# Patient Record
Sex: Female | Born: 1985 | Race: Black or African American | Hispanic: No | Marital: Married | Smoking: Never smoker
Health system: Southern US, Community
[De-identification: ages and names within clinical notes are randomized; demographics above are authoritative.]

## PROBLEM LIST (undated history)

## (undated) ENCOUNTER — Inpatient Hospital Stay (HOSPITAL_COMMUNITY): Payer: Self-pay

## (undated) DIAGNOSIS — R87629 Unspecified abnormal cytological findings in specimens from vagina: Secondary | ICD-10-CM

## (undated) DIAGNOSIS — N920 Excessive and frequent menstruation with regular cycle: Secondary | ICD-10-CM

## (undated) HISTORY — DX: Unspecified abnormal cytological findings in specimens from vagina: R87.629

## (undated) HISTORY — DX: Excessive and frequent menstruation with regular cycle: N92.0

## (undated) HISTORY — PX: COLPOSCOPY: SHX161

---

## 2013-12-02 NOTE — L&D Delivery Note (Signed)
Delivery Note At 3:10 PM a viable and healthy female was delivered via Vaginal, Spontaneous Delivery (Presentation: Left Occiput Anterior).  APGAR: 9, 9; weight .- pending Placenta status: Intact, Spontaneous.  Cord: 3 vessels with the following complications: None.  Cord pH: N/A  Anesthesia: Epidural  Episiotomy: None Lacerations: None Suture Repair: N/A Est. Blood Loss (mL): 250  Mom to postpartum.  Baby to Couplet care / Skin to Skin.  Alyaan Budzynski R 05/13/2014, 3:35 PM

## 2014-03-16 LAB — OB RESULTS CONSOLE ABO/RH: RH Type: NEGATIVE

## 2014-03-16 LAB — OB RESULTS CONSOLE GC/CHLAMYDIA
Chlamydia: NEGATIVE
GC PROBE AMP, GENITAL: NEGATIVE

## 2014-03-16 LAB — OB RESULTS CONSOLE HIV ANTIBODY (ROUTINE TESTING): HIV: NONREACTIVE

## 2014-03-16 LAB — OB RESULTS CONSOLE HEPATITIS B SURFACE ANTIGEN: Hepatitis B Surface Ag: NEGATIVE

## 2014-03-16 LAB — OB RESULTS CONSOLE ANTIBODY SCREEN: Antibody Screen: NEGATIVE

## 2014-03-16 LAB — OB RESULTS CONSOLE RPR: RPR: NONREACTIVE

## 2014-03-16 LAB — OB RESULTS CONSOLE RUBELLA ANTIBODY, IGM: Rubella: IMMUNE

## 2014-04-30 ENCOUNTER — Encounter (HOSPITAL_COMMUNITY): Payer: Self-pay | Admitting: *Deleted

## 2014-04-30 ENCOUNTER — Inpatient Hospital Stay (HOSPITAL_COMMUNITY)
Admission: AD | Admit: 2014-04-30 | Discharge: 2014-04-30 | Disposition: A | Discharge status: Home / Self Care | Source: Ambulatory Visit | Attending: Obstetrics and Gynecology | Admitting: Obstetrics and Gynecology

## 2014-04-30 DIAGNOSIS — O99891 Other specified diseases and conditions complicating pregnancy: Secondary | ICD-10-CM | POA: Insufficient documentation

## 2014-04-30 DIAGNOSIS — R109 Unspecified abdominal pain: Secondary | ICD-10-CM | POA: Insufficient documentation

## 2014-04-30 DIAGNOSIS — O9989 Other specified diseases and conditions complicating pregnancy, childbirth and the puerperium: Principal | ICD-10-CM

## 2014-04-30 LAB — URINALYSIS, ROUTINE W REFLEX MICROSCOPIC
Bilirubin Urine: NEGATIVE
GLUCOSE, UA: NEGATIVE mg/dL
Hgb urine dipstick: NEGATIVE
Ketones, ur: 15 mg/dL — AB
Leukocytes, UA: NEGATIVE
Nitrite: NEGATIVE
PROTEIN: NEGATIVE mg/dL
Specific Gravity, Urine: 1.01 (ref 1.005–1.030)
Urobilinogen, UA: 1 mg/dL (ref 0.0–1.0)
pH: 7.5 (ref 5.0–8.0)

## 2014-04-30 NOTE — MAU Note (Signed)
Patient presents with complaint of abdominal tightening X several days that worsened last night.

## 2014-04-30 NOTE — Discharge Instructions (Signed)

## 2014-05-04 LAB — OB RESULTS CONSOLE GBS: STREP GROUP B AG: NEGATIVE

## 2014-05-06 ENCOUNTER — Encounter (HOSPITAL_COMMUNITY): Payer: Self-pay | Admitting: *Deleted

## 2014-05-06 ENCOUNTER — Telehealth (HOSPITAL_COMMUNITY): Payer: Self-pay | Admitting: *Deleted

## 2014-05-06 NOTE — Telephone Encounter (Signed)
Preadmission screen  

## 2014-05-09 ENCOUNTER — Other Ambulatory Visit: Payer: Self-pay | Admitting: Obstetrics & Gynecology

## 2014-05-13 ENCOUNTER — Encounter (HOSPITAL_COMMUNITY): Payer: Self-pay

## 2014-05-13 ENCOUNTER — Inpatient Hospital Stay (HOSPITAL_COMMUNITY): Admitting: Anesthesiology

## 2014-05-13 ENCOUNTER — Inpatient Hospital Stay (HOSPITAL_COMMUNITY)
Admission: RE | Admit: 2014-05-13 | Discharge: 2014-05-14 | DRG: 775 | Disposition: A | Discharge status: Home / Self Care | Source: Ambulatory Visit | Attending: Obstetrics & Gynecology | Admitting: Obstetrics & Gynecology

## 2014-05-13 ENCOUNTER — Encounter (HOSPITAL_COMMUNITY): Admitting: Anesthesiology

## 2014-05-13 DIAGNOSIS — O9903 Anemia complicating the puerperium: Principal | ICD-10-CM | POA: Diagnosis present

## 2014-05-13 DIAGNOSIS — K219 Gastro-esophageal reflux disease without esophagitis: Secondary | ICD-10-CM | POA: Diagnosis present

## 2014-05-13 DIAGNOSIS — O360990 Maternal care for other rhesus isoimmunization, unspecified trimester, not applicable or unspecified: Secondary | ICD-10-CM | POA: Diagnosis present

## 2014-05-13 DIAGNOSIS — Z3490 Encounter for supervision of normal pregnancy, unspecified, unspecified trimester: Secondary | ICD-10-CM

## 2014-05-13 DIAGNOSIS — D62 Acute posthemorrhagic anemia: Secondary | ICD-10-CM | POA: Diagnosis present

## 2014-05-13 LAB — CBC
HEMATOCRIT: 30.3 % — AB (ref 36.0–46.0)
HEMOGLOBIN: 9.9 g/dL — AB (ref 12.0–15.0)
MCH: 29 pg (ref 26.0–34.0)
MCHC: 32.7 g/dL (ref 30.0–36.0)
MCV: 88.9 fL (ref 78.0–100.0)
Platelets: 228 10*3/uL (ref 150–400)
RBC: 3.41 MIL/uL — ABNORMAL LOW (ref 3.87–5.11)
RDW: 13.8 % (ref 11.5–15.5)
WBC: 6.6 10*3/uL (ref 4.0–10.5)

## 2014-05-13 LAB — ABO/RH: ABO/RH(D): O NEG

## 2014-05-13 LAB — RPR

## 2014-05-13 MED ORDER — PHENYLEPHRINE 40 MCG/ML (10ML) SYRINGE FOR IV PUSH (FOR BLOOD PRESSURE SUPPORT)
PREFILLED_SYRINGE | INTRAVENOUS | Status: AC
  Filled 2014-05-13: qty 10

## 2014-05-13 MED ORDER — ONDANSETRON HCL 4 MG/2ML IJ SOLN: 4.0000 mg | Freq: Four times a day (QID) | INTRAMUSCULAR | Status: DC | PRN

## 2014-05-13 MED ORDER — LIDOCAINE HCL (PF) 1 % IJ SOLN
INTRAMUSCULAR | Status: DC | PRN
  Administered 2014-05-13 (×2): 4 mL

## 2014-05-13 MED ORDER — TERBUTALINE SULFATE 1 MG/ML IJ SOLN: 0.2500 mg | Freq: Once | INTRAMUSCULAR | Status: DC | PRN

## 2014-05-13 MED ORDER — CITRIC ACID-SODIUM CITRATE 334-500 MG/5ML PO SOLN
30.0000 mL | ORAL | Status: DC | PRN
Start: 2014-05-13 — End: 2014-05-13

## 2014-05-13 MED ORDER — ONDANSETRON HCL 4 MG PO TABS: 4.0000 mg | ORAL_TABLET | ORAL | Status: DC | PRN

## 2014-05-13 MED ORDER — SENNOSIDES-DOCUSATE SODIUM 8.6-50 MG PO TABS
2.0000 | ORAL_TABLET | ORAL | Status: DC
  Administered 2014-05-13: 2 via ORAL
  Filled 2014-05-13: qty 2

## 2014-05-13 MED ORDER — IBUPROFEN 600 MG PO TABS
600.0000 mg | ORAL_TABLET | Freq: Four times a day (QID) | ORAL | Status: DC
  Administered 2014-05-13 – 2014-05-14 (×4): 600 mg via ORAL
  Filled 2014-05-13 (×5): qty 1

## 2014-05-13 MED ORDER — OXYCODONE-ACETAMINOPHEN 5-325 MG PO TABS
1.0000 | ORAL_TABLET | ORAL | Status: DC | PRN
  Administered 2014-05-13 – 2014-05-14 (×3): 1 via ORAL
  Filled 2014-05-13 (×3): qty 1

## 2014-05-13 MED ORDER — EPHEDRINE 5 MG/ML INJ
10.0000 mg | INTRAVENOUS | Status: DC | PRN
  Filled 2014-05-13: qty 2

## 2014-05-13 MED ORDER — ACETAMINOPHEN 325 MG PO TABS: 650.0000 mg | ORAL_TABLET | ORAL | Status: DC | PRN

## 2014-05-13 MED ORDER — LACTATED RINGERS IV SOLN
500.0000 mL | Freq: Once | INTRAVENOUS | Status: AC
  Administered 2014-05-13: 500 mL via INTRAVENOUS

## 2014-05-13 MED ORDER — DIBUCAINE 1 % RE OINT: 1.0000 "application " | TOPICAL_OINTMENT | RECTAL | Status: DC | PRN

## 2014-05-13 MED ORDER — LANOLIN HYDROUS EX OINT: TOPICAL_OINTMENT | CUTANEOUS | Status: DC | PRN

## 2014-05-13 MED ORDER — DIPHENHYDRAMINE HCL 50 MG/ML IJ SOLN: 12.5000 mg | INTRAMUSCULAR | Status: DC | PRN

## 2014-05-13 MED ORDER — FENTANYL 2.5 MCG/ML BUPIVACAINE 1/10 % EPIDURAL INFUSION (WH - ANES)
INTRAMUSCULAR | Status: DC | PRN
  Administered 2014-05-13: 14 mL/h via EPIDURAL

## 2014-05-13 MED ORDER — SIMETHICONE 80 MG PO CHEW: 80.0000 mg | CHEWABLE_TABLET | ORAL | Status: DC | PRN

## 2014-05-13 MED ORDER — TETANUS-DIPHTH-ACELL PERTUSSIS 5-2.5-18.5 LF-MCG/0.5 IM SUSP: 0.5000 mL | Freq: Once | INTRAMUSCULAR | Status: DC

## 2014-05-13 MED ORDER — MISOPROSTOL 25 MCG QUARTER TABLET
25.0000 ug | ORAL_TABLET | Freq: Once | ORAL | Status: AC
  Administered 2014-05-13: 25 ug via VAGINAL
  Filled 2014-05-13: qty 0.25

## 2014-05-13 MED ORDER — ZOLPIDEM TARTRATE 5 MG PO TABS: 5.0000 mg | ORAL_TABLET | Freq: Every evening | ORAL | Status: DC | PRN

## 2014-05-13 MED ORDER — OXYTOCIN BOLUS FROM INFUSION: 500.0000 mL | INTRAVENOUS | Status: DC

## 2014-05-13 MED ORDER — BENZOCAINE-MENTHOL 20-0.5 % EX AERO
1.0000 "application " | INHALATION_SPRAY | CUTANEOUS | Status: DC | PRN
  Filled 2014-05-13: qty 56

## 2014-05-13 MED ORDER — FENTANYL 2.5 MCG/ML BUPIVACAINE 1/10 % EPIDURAL INFUSION (WH - ANES)
INTRAMUSCULAR | Status: AC
  Filled 2014-05-13: qty 125

## 2014-05-13 MED ORDER — WITCH HAZEL-GLYCERIN EX PADS: 1.0000 "application " | MEDICATED_PAD | CUTANEOUS | Status: DC | PRN

## 2014-05-13 MED ORDER — OXYCODONE-ACETAMINOPHEN 5-325 MG PO TABS: 1.0000 | ORAL_TABLET | ORAL | Status: DC | PRN

## 2014-05-13 MED ORDER — LACTATED RINGERS IV SOLN
500.0000 mL | INTRAVENOUS | Status: DC | PRN
  Administered 2014-05-13: 500 mL via INTRAVENOUS

## 2014-05-13 MED ORDER — ONDANSETRON HCL 4 MG/2ML IJ SOLN: 4.0000 mg | INTRAMUSCULAR | Status: DC | PRN

## 2014-05-13 MED ORDER — PHENYLEPHRINE 40 MCG/ML (10ML) SYRINGE FOR IV PUSH (FOR BLOOD PRESSURE SUPPORT)
80.0000 ug | PREFILLED_SYRINGE | INTRAVENOUS | Status: DC | PRN
  Filled 2014-05-13: qty 2

## 2014-05-13 MED ORDER — OXYTOCIN 40 UNITS IN LACTATED RINGERS INFUSION - SIMPLE MED: 62.5000 mL/h | INTRAVENOUS | Status: DC

## 2014-05-13 MED ORDER — LIDOCAINE HCL (PF) 1 % IJ SOLN
30.0000 mL | INTRAMUSCULAR | Status: DC | PRN
  Filled 2014-05-13: qty 30

## 2014-05-13 MED ORDER — OXYTOCIN 40 UNITS IN LACTATED RINGERS INFUSION - SIMPLE MED
1.0000 m[IU]/min | INTRAVENOUS | Status: DC
  Filled 2014-05-13: qty 1000

## 2014-05-13 MED ORDER — PRENATAL MULTIVITAMIN CH
1.0000 | ORAL_TABLET | Freq: Every day | ORAL | Status: DC
  Administered 2014-05-14: 1 via ORAL
  Filled 2014-05-13: qty 1

## 2014-05-13 MED ORDER — EPHEDRINE 5 MG/ML INJ
INTRAVENOUS | Status: AC
  Filled 2014-05-13: qty 4

## 2014-05-13 MED ORDER — FENTANYL 2.5 MCG/ML BUPIVACAINE 1/10 % EPIDURAL INFUSION (WH - ANES): 14.0000 mL/h | INTRAMUSCULAR | Status: DC | PRN

## 2014-05-13 MED ORDER — FLEET ENEMA 7-19 GM/118ML RE ENEM: 1.0000 | ENEMA | Freq: Every day | RECTAL | Status: DC | PRN

## 2014-05-13 MED ORDER — LACTATED RINGERS IV SOLN
INTRAVENOUS | Status: DC
  Administered 2014-05-13 (×2): via INTRAVENOUS

## 2014-05-13 MED ORDER — IBUPROFEN 600 MG PO TABS: 600.0000 mg | ORAL_TABLET | Freq: Four times a day (QID) | ORAL | Status: DC | PRN

## 2014-05-13 MED ORDER — DIPHENHYDRAMINE HCL 25 MG PO CAPS
25.0000 mg | ORAL_CAPSULE | Freq: Four times a day (QID) | ORAL | Status: DC | PRN
Start: 2014-05-13 — End: 2014-05-14

## 2014-05-13 MED ORDER — PHENYLEPHRINE 40 MCG/ML (10ML) SYRINGE FOR IV PUSH (FOR BLOOD PRESSURE SUPPORT)
80.0000 ug | PREFILLED_SYRINGE | INTRAVENOUS | Status: DC | PRN
  Administered 2014-05-13 (×2): 80 ug via INTRAVENOUS
  Filled 2014-05-13: qty 2

## 2014-05-13 NOTE — Anesthesia Preprocedure Evaluation (Signed)
Anesthesia Evaluation  Patient identified by MRN, date of birth, ID band Patient awake    Reviewed: Allergy & Precautions, H&P , Patient's Chart, lab work & pertinent test results  Airway Mallampati: III TM Distance: >3 FB Neck ROM: Full    Dental no notable dental hx. (+) Teeth Intact   Pulmonary neg pulmonary ROS,  breath sounds clear to auscultation  Pulmonary exam normal       Cardiovascular negative cardio ROS  Rhythm:Regular Rate:Normal     Neuro/Psych negative neurological ROS  negative psych ROS   GI/Hepatic Neg liver ROS, GERD-  Medicated and Controlled,  Endo/Other  negative endocrine ROS  Renal/GU negative Renal ROS  negative genitourinary   Musculoskeletal negative musculoskeletal ROS (+)   Abdominal (+) - obese,   Peds  Hematology negative hematology ROS (+)   Anesthesia Other Findings   Reproductive/Obstetrics (+) Pregnancy                           Anesthesia Physical Anesthesia Plan  ASA: II  Anesthesia Plan: Epidural   Post-op Pain Management:    Induction:   Airway Management Planned: Natural Airway  Additional Equipment:   Intra-op Plan:   Post-operative Plan:   Informed Consent: I have reviewed the patients History and Physical, chart, labs and discussed the procedure including the risks, benefits and alternatives for the proposed anesthesia with the patient or authorized representative who has indicated his/her understanding and acceptance.     Plan Discussed with: Anesthesiologist  Anesthesia Plan Comments:         Anesthesia Quick Evaluation

## 2014-05-13 NOTE — H&P (Signed)
Tara [name] is a 28 y.o. female presenting at 4039 wks for labor IOL due to child care problems/  Late transfer of [facility] since husband moved, retired from [employer]. Good prenatal care prior to moving, all records available, normal labs and ultrasound, failed 1 hr glucola but passed 3 hr GTT before [place]. Rhogam at 28 wks. TDaP here after transfer. LSIL Pap in preg, Colpo prior to transfer, will check PP pap.  3 uncomplicated SVDs.   History OB History   Grav Para Term Preterm Abortions TAB SAB Ect Mult Living   4 3 3   0  0   3     Past Medical History  Diagnosis Date  . Medical history non-contributory   . GERD (gastroesophageal reflux disease)   . Vaginal Pap smear, abnormal    Past Surgical History  Procedure Laterality Date  . No past surgeries    . Colposcopy     Family History: family history is negative for Alcohol abuse, Arthritis, Asthma, Birth defects, Cancer, COPD, Diabetes, Depression, Drug abuse, Early death, Hearing loss, Heart disease, Hyperlipidemia, Hypertension, Kidney disease, Learning disabilities, Mental illness, Mental retardation, Miscarriages / Stillbirths, Stroke, Vision loss, and Varicose Veins. Social History:  reports that she has never smoked. She has never used smokeless tobacco. She reports that she does not drink alcohol or use illicit drugs.   Prenatal Transfer Tool  Maternal Diabetes: No (3 hr GTT normal)  Genetic Screening: Normal QUAD screen neg  Maternal Ultrasounds/Referrals: Normal Fetal Ultrasounds or other Referrals:  None Maternal Substance Abuse:  No Significant Maternal Medications:  None Significant Maternal Lab Results:  GBS(-), maternal anemia  Other Comments:  None  ROS neg    Exam Physical Exam  BP 98/50  Pulse 66  Temp(Src) 97.8 F (36.6 C) (Oral)  Resp 18  Ht 5' 3.5" (1.613 m)  Wt 153 lb (69.4 kg)  BMI 26.67 kg/m2  SpO2 100%  LMP 08/13/2013 A&O x 3, no acute distress. Pleasant HEENT neg, no  thyromegaly Lungs CTA bilat CV RRR, S1S2 normal Abdo soft, non tender, non acute Extr no edema/ tenderness Pelvic 1-2/thick/-4//VTx at admission FHT  140s/ category I Toco none EFW 7.1/2 to 8 lbs  Prenatal labs: ABO, Rh: O/Negative/-- (04/15 0000) Antibody: Negative (04/15 0000) Rubella: Immune (04/15 0000) RPR: NON REAC (06/12 0740)  HBsAg: Negative (04/15 0000)  HIV: Non-reactive (04/15 0000)  GBS: Negative (06/03 0000)  QUAD scr neg  Assessment/Plan: 28 yo G4P3003, 39wks for labor induction due to social reasons. Cytotec 25 mcg x 1 vaginal./ GBS neg. Anemia. Anticipate SVD.   Janson Lamar R 05/13/2014

## 2014-05-13 NOTE — Anesthesia Procedure Notes (Signed)
Epidural Patient location during procedure: OB Start time: 05/13/2014 2:01 PM  Staffing Anesthesiologist: Farren Landa A. Performed by: anesthesiologist   Preanesthetic Checklist Completed: patient identified, site marked, surgical consent, pre-op evaluation, timeout performed, IV checked, risks and benefits discussed and monitors and equipment checked  Epidural Patient position: sitting Prep: site prepped and draped and DuraPrep Patient monitoring: continuous pulse ox and blood pressure Approach: midline Location: L4-L5 Injection technique: LOR air  Needle:  Needle type: Tuohy  Needle gauge: 17 G Needle length: 9 cm and 9 Needle insertion depth: 4 cm Catheter type: closed end flexible Catheter size: 19 Gauge Catheter at skin depth: 9 cm Test dose: negative and Other  Assessment Events: blood not aspirated, injection not painful, no injection resistance, negative IV test and no paresthesia  Additional Notes Patient identified. Risks and benefits discussed including failed block, incomplete  Pain control, post dural puncture headache, nerve damage, paralysis, blood pressure Changes, nausea, vomiting, reactions to medications-both toxic and allergic and post Partum back pain. All questions were answered. Patient expressed understanding and wished to proceed. Sterile technique was used throughout procedure. Epidural site was Dressed with sterile barrier dressing. No paresthesias, signs of intravascular injection Or signs of intrathecal spread were encountered.  Patient was more comfortable after the epidural was dosed. Please see RN's note for documentation of vital signs and FHR which are stable.

## 2014-05-13 NOTE — Progress Notes (Signed)
Updated provider on pt UCs and FHR tracing--orders to continue to watch UC pattern and start pitocin if UCs spaceout

## 2014-05-14 ENCOUNTER — Encounter (HOSPITAL_COMMUNITY): Payer: Self-pay

## 2014-05-14 LAB — CBC
HCT: 28.3 % — ABNORMAL LOW (ref 36.0–46.0)
Hemoglobin: 9.4 g/dL — ABNORMAL LOW (ref 12.0–15.0)
MCH: 29.5 pg (ref 26.0–34.0)
MCHC: 33.2 g/dL (ref 30.0–36.0)
MCV: 88.7 fL (ref 78.0–100.0)
Platelets: 240 10*3/uL (ref 150–400)
RBC: 3.19 MIL/uL — AB (ref 3.87–5.11)
RDW: 13.6 % (ref 11.5–15.5)
WBC: 9 10*3/uL (ref 4.0–10.5)

## 2014-05-14 MED ORDER — MAGNESIUM OXIDE 400 (241.3 MG) MG PO TABS: 200.0000 mg | ORAL_TABLET | Freq: Every day | ORAL | Status: DC

## 2014-05-14 MED ORDER — RHO D IMMUNE GLOBULIN 1500 UNIT/2ML IJ SOSY
300.0000 ug | PREFILLED_SYRINGE | Freq: Once | INTRAMUSCULAR | Status: AC
  Administered 2014-05-14: 300 ug via INTRAVENOUS
  Filled 2014-05-14: qty 2

## 2014-05-14 MED ORDER — POLYSACCHARIDE IRON COMPLEX 150 MG PO CAPS: 150.0000 mg | ORAL_CAPSULE | Freq: Two times a day (BID) | ORAL | Status: DC

## 2014-05-14 MED ORDER — POLYSACCHARIDE IRON COMPLEX 150 MG PO CAPS
150.0000 mg | ORAL_CAPSULE | Freq: Two times a day (BID) | ORAL | Status: DC
  Administered 2014-05-14: 150 mg via ORAL
  Filled 2014-05-14 (×3): qty 1

## 2014-05-14 MED ORDER — IBUPROFEN 600 MG PO TABS: 600.0000 mg | ORAL_TABLET | Freq: Four times a day (QID) | ORAL | Status: DC

## 2014-05-14 MED ORDER — MAGNESIUM OXIDE 400 (241.3 MG) MG PO TABS
200.0000 mg | ORAL_TABLET | Freq: Every day | ORAL | Status: DC
  Administered 2014-05-14: 200 mg via ORAL
  Filled 2014-05-14 (×2): qty 0.5

## 2014-05-14 NOTE — Anesthesia Postprocedure Evaluation (Signed)
Anesthesia Post Note  Patient: Tara [name]  Procedure(s) Performed: * No procedures listed *  Anesthesia type: Epidural  Patient location: Mother/Baby  Post pain: Pain level controlled  Post assessment: Post-op Vital signs reviewed  Last Vitals:  Filed Vitals:   05/14/14 0531  BP: 120/71  Pulse: 80  Temp: 36.7 C  Resp: 18    Post vital signs: Reviewed  Level of consciousness:alert  Complications: No apparent anesthesia complications

## 2014-05-14 NOTE — Progress Notes (Signed)
PPD #1- SVD  Subjective:   Reports feeling well, wants early discharge Requesting birth control prior to discharge Tolerating po/ No nausea or vomiting Bleeding is light Pain controlled with Motrin Up ad lib / ambulatory / voiding without problems Newborn: bottlefeeding  / Circumcision: planning   Objective:   VS:  VS:  Filed Vitals:   05/13/14 1715 05/13/14 1815 05/13/14 2215 05/14/14 0531  BP: 104/68 117/80 124/76 120/71  Pulse: 86 82 76 80  Temp: 98.8 F (37.1 C) 98 F (36.7 C) 98.7 F (37.1 C) 98 F (36.7 C)  TempSrc: Oral Oral Oral Oral  Resp: 20 20 20 18   Height:      Weight:      SpO2:        LABS:  Recent Labs  05/13/14 0740 05/14/14 0555  WBC 6.6 9.0  HGB 9.9* 9.4*  PLT 228 240   Blood type: --/--/O NEG (06/12 0800) Rubella: Immune (04/15 0000)   I&O: Intake/Output     06/12 0701 - 06/13 0700 06/13 0701 - 06/14 0700   Urine (mL/kg/hr) 250 (0.2)    Blood 550 (0.3)    Total Output 800     Net -800          Urine Occurrence 2 x      Physical Exam: Alert and oriented x3 Abdomen: soft, non-tender, non-distended  Fundus: firm, non-tender, U-2 Perineum: intact Lochia: small Extremities: no edema, no calf pain or tenderness    Assessment:  PPD # 1G4P4004/ S/P:induced vaginal  IDA with compounding ABL anemia  Rh negative Doing well    Plan: Possible discharge later today Offered Depo for contraception, declines; advised no intercourse until after postpartum visit at 6 wks Rhogam prior to discharge Continue routine post partum orders    Donette LarryBHAMBRI, Quintessa Simmerman, N MSN, CNM 05/14/2014, 10:13 AM

## 2014-05-15 LAB — RH IG WORKUP (INCLUDES ABO/RH)
ABO/RH(D): O NEG
Antibody Screen: POSITIVE
DAT, IgG: NEGATIVE
Fetal Screen: NEGATIVE
Gestational Age(Wks): 39
Unit division: 0

## 2014-05-15 NOTE — Discharge Summary (Signed)
Obstetric Discharge Summary Reason for Admission: induction of labor and elective Prenatal Procedures: ultrasound and failed 1hr GTT, passed 3hr, Rhogam @28  wks, IDA Intrapartum Procedures: spontaneous vaginal delivery Postpartum Procedures: Rho(D) Ig Complications-Operative and Postpartum: none Hemoglobin  Date Value Ref Range Status  05/14/2014 9.4* 12.0 - 15.0 g/dL Final     HCT  Date Value Ref Range Status  05/14/2014 28.3* 36.0 - 46.0 % Final    Physical Exam:  General: alert and cooperative Lochia: appropriate Uterine Fundus: firm DVT Evaluation: No evidence of DVT seen on physical exam. Negative Homan's sign. No cords or calf tenderness. No significant calf/ankle edema.  Discharge Diagnoses: Term Pregnancy-delivered and Anemia  Discharge Information: Date: 05/15/2014 Activity: pelvic rest Diet: routine Medications: PNV, Ibuprofen and Iron Condition: stable Instructions: refer to practice specific booklet Discharge to: home Follow-up Information   Follow up with MODY,VAISHALI R, MD. Schedule an appointment as soon as possible for a visit in 6 weeks.   Specialty:  Obstetrics and Gynecology   Contact information:   [address] G[address] [place] [id number] [phone]       Newborn Data: Live born female on 05/13/14 Birth Weight: 8 lb 3.6 oz (3731 g) APGAR: 9, 9  Home with mother.  Charita Lindenberger, N 05/15/2014, 6:56 PM

## 2014-05-16 NOTE — Discharge Summary (Signed)
Reviewed and agree with note and plan. V.Matthews Franks, MD  

## 2014-10-03 ENCOUNTER — Encounter (HOSPITAL_COMMUNITY): Payer: Self-pay

## 2015-11-06 ENCOUNTER — Ambulatory Visit (INDEPENDENT_AMBULATORY_CARE_PROVIDER_SITE_OTHER): Admitting: Family Medicine

## 2015-11-06 ENCOUNTER — Encounter: Payer: Self-pay | Admitting: Family Medicine

## 2015-11-06 VITALS — BP 135/83 | HR 106 | Wt 150.0 lb

## 2015-11-06 DIAGNOSIS — Z3009 Encounter for other general counseling and advice on contraception: Secondary | ICD-10-CM

## 2015-11-06 DIAGNOSIS — Z30011 Encounter for initial prescription of contraceptive pills: Secondary | ICD-10-CM

## 2015-11-06 DIAGNOSIS — Z124 Encounter for screening for malignant neoplasm of cervix: Secondary | ICD-10-CM | POA: Diagnosis not present

## 2015-11-06 DIAGNOSIS — Z01419 Encounter for gynecological examination (general) (routine) without abnormal findings: Secondary | ICD-10-CM

## 2015-11-06 MED ORDER — NORGESTIMATE-ETH ESTRADIOL 0.25-35 MG-MCG PO TABS: 1.0000 | ORAL_TABLET | Freq: Every day | ORAL | Status: DC

## 2015-11-06 NOTE — Patient Instructions (Signed)
Preventive Care for Adults, Female A healthy lifestyle and preventive care can promote health and wellness. Preventive health guidelines for women include the following key practices.  A routine yearly physical is a good way to check with your health care provider about your health and preventive screening. It is a chance to share any concerns and updates on your health and to receive a thorough exam.  Visit your dentist for a routine exam and preventive care every 6 months. Brush your teeth twice a day and floss once a day. Good oral hygiene prevents tooth decay and gum disease.  The frequency of eye exams is based on your age, health, family medical history, use of contact lenses, and other factors. Follow your health care provider's recommendations for frequency of eye exams.  Eat a healthy diet. Foods like vegetables, fruits, whole grains, low-fat dairy products, and lean protein foods contain the nutrients you need without too many calories. Decrease your intake of foods high in solid fats, added sugars, and salt. Eat the right amount of calories for you.Get information about a proper diet from your health care provider, if necessary.  Regular physical exercise is one of the most important things you can do for your health. Most adults should get at least 150 minutes of moderate-intensity exercise (any activity that increases your heart rate and causes you to sweat) each week. In addition, most adults need muscle-strengthening exercises on 2 or more days a week.  Maintain a healthy weight. The body mass index (BMI) is a screening tool to identify possible weight problems. It provides an estimate of body fat based on height and weight. Your health care provider can find your BMI and can help you achieve or maintain a healthy weight.For adults 20 years and older:  A BMI below 18.5 is considered underweight.  A BMI of 18.5 to 24.9 is normal.  A BMI of 25 to 29.9 is considered overweight.  A  BMI of 30 and above is considered obese.  Maintain normal blood lipids and cholesterol levels by exercising and minimizing your intake of saturated fat. Eat a balanced diet with plenty of fruit and vegetables. Blood tests for lipids and cholesterol should begin at age 29 and be repeated every 5 years. If your lipid or cholesterol levels are high, you are over 50, or you are at high risk for heart disease, you may need your cholesterol levels checked more frequently.Ongoing high lipid and cholesterol levels should be treated with medicines if diet and exercise are not working.  If you smoke, find out from your health care provider how to quit. If you do not use tobacco, do not start.  Lung cancer screening is recommended for adults aged 42-80 years who are at high risk for developing lung cancer because of a history of smoking. A yearly low-dose CT scan of the lungs is recommended for people who have at least a 30-pack-year history of smoking and are a current smoker or have quit within the past 15 years. A pack year of smoking is smoking an average of 1 pack of cigarettes a day for 1 year (for example: 1 pack a day for 30 years or 2 packs a day for 15 years). Yearly screening should continue until the smoker has stopped smoking for at least 15 years. Yearly screening should be stopped for people who develop a health problem that would prevent them from having lung cancer treatment.  If you are pregnant, do not drink alcohol. If you are  breastfeeding, be very cautious about drinking alcohol. If you are not pregnant and choose to drink alcohol, do not have more than 1 drink per day. One drink is considered to be 12 ounces (355 mL) of beer, 5 ounces (148 mL) of wine, or 1.5 ounces (44 mL) of liquor.  Avoid use of street drugs. Do not share needles with anyone. Ask for help if you need support or instructions about stopping the use of drugs.  High blood pressure causes heart disease and increases the risk  of stroke. Your blood pressure should be checked at least every 1 to 2 years. Ongoing high blood pressure should be treated with medicines if weight loss and exercise do not work.  If you are 45-73 years old, ask your health care provider if you should take aspirin to prevent strokes.  Diabetes screening is done by taking a blood sample to check your blood glucose level after you have not eaten for a certain period of time (fasting). If you are not overweight and you do not have risk factors for diabetes, you should be screened once every 3 years starting at age 25. If you are overweight or obese and you are 2-78 years of age, you should be screened for diabetes every year as part of your cardiovascular risk assessment.  Breast cancer screening is essential preventive care for women. You should practice "breast self-awareness." This means understanding the normal appearance and feel of your breasts and may include breast self-examination. Any changes detected, no matter how small, should be reported to a health care provider. Women in their 60s and 30s should have a clinical breast exam (CBE) by a health care provider as part of a regular health exam every 1 to 3 years. After age 30, women should have a CBE every year. Starting at age 70, women should consider having a mammogram (breast X-ray test) every year. Women who have a family history of breast cancer should talk to their health care provider about genetic screening. Women at a high risk of breast cancer should talk to their health care providers about having an MRI and a mammogram every year.  Breast cancer gene (BRCA)-related cancer risk assessment is recommended for women who have family members with BRCA-related cancers. BRCA-related cancers include breast, ovarian, tubal, and peritoneal cancers. Having family members with these cancers may be associated with an increased risk for harmful changes (mutations) in the breast cancer genes BRCA1 and  BRCA2. Results of the assessment will determine the need for genetic counseling and BRCA1 and BRCA2 testing.  Your health care provider may recommend that you be screened regularly for cancer of the pelvic organs (ovaries, uterus, and vagina). This screening involves a pelvic examination, including checking for microscopic changes to the surface of your cervix (Pap test). You may be encouraged to have this screening done every 3 years, beginning at age 83.  For women ages 55-65, health care providers may recommend pelvic exams and Pap testing every 3 years, or they may recommend the Pap and pelvic exam, combined with testing for human papilloma virus (HPV), every 5 years. Some types of HPV increase your risk of cervical cancer. Testing for HPV may also be done on women of any age with unclear Pap test results.  Other health care providers may not recommend any screening for nonpregnant women who are considered low risk for pelvic cancer and who do not have symptoms. Ask your health care provider if a screening pelvic exam is right for  you.  If you have had past treatment for cervical cancer or a condition that could lead to cancer, you need Pap tests and screening for cancer for at least 20 years after your treatment. If Pap tests have been discontinued, your risk factors (such as having a new sexual partner) need to be reassessed to determine if screening should resume. Some women have medical problems that increase the chance of getting cervical cancer. In these cases, your health care provider may recommend more frequent screening and Pap tests.  Colorectal cancer can be detected and often prevented. Most routine colorectal cancer screening begins at the age of 50 years and continues through age 75 years. However, your health care provider may recommend screening at an earlier age if you have risk factors for colon cancer. On a yearly basis, your health care provider may provide home test kits to check  for hidden blood in the stool. Use of a small camera at the end of a tube, to directly examine the colon (sigmoidoscopy or colonoscopy), can detect the earliest forms of colorectal cancer. Talk to your health care provider about this at age 50, when routine screening begins. Direct exam of the colon should be repeated every 5-10 years through age 75 years, unless early forms of precancerous polyps or small growths are found.  People who are at an increased risk for hepatitis B should be screened for this virus. You are considered at high risk for hepatitis B if:  You were born in a country where hepatitis B occurs often. Talk with your health care provider about which countries are considered high risk.  Your parents were born in a high-risk country and you have not received a shot to protect against hepatitis B (hepatitis B vaccine).  You have HIV or AIDS.  You use needles to inject street drugs.  You live with, or have sex with, someone who has hepatitis B.  You get hemodialysis treatment.  You take certain medicines for conditions like cancer, organ transplantation, and autoimmune conditions.  Hepatitis C blood testing is recommended for all people born from 1945 through 1965 and any individual with known risks for hepatitis C.  Practice safe sex. Use condoms and avoid high-risk sexual practices to reduce the spread of sexually transmitted infections (STIs). STIs include gonorrhea, chlamydia, syphilis, trichomonas, herpes, HPV, and human immunodeficiency virus (HIV). Herpes, HIV, and HPV are viral illnesses that have no cure. They can result in disability, cancer, and death.  You should be screened for sexually transmitted illnesses (STIs) including gonorrhea and chlamydia if:  You are sexually active and are younger than 24 years.  You are older than 24 years and your health care provider tells you that you are at risk for this type of infection.  Your sexual activity has changed  since you were last screened and you are at an increased risk for chlamydia or gonorrhea. Ask your health care provider if you are at risk.  If you are at risk of being infected with HIV, it is recommended that you take a prescription medicine daily to prevent HIV infection. This is called preexposure prophylaxis (PrEP). You are considered at risk if:  You are sexually active and do not regularly use condoms or know the HIV status of your partner(s).  You take drugs by injection.  You are sexually active with a partner who has HIV.  Talk with your health care provider about whether you are at high risk of being infected with HIV. If   you choose to begin PrEP, you should first be tested for HIV. You should then be tested every 3 months for as long as you are taking PrEP.  Osteoporosis is a disease in which the bones lose minerals and strength with aging. This can result in serious bone fractures or breaks. The risk of osteoporosis can be identified using a bone density scan. Women ages 5 years and over and women at risk for fractures or osteoporosis should discuss screening with their health care providers. Ask your health care provider whether you should take a calcium supplement or vitamin D to reduce the rate of osteoporosis.  Menopause can be associated with physical symptoms and risks. Hormone replacement therapy is available to decrease symptoms and risks. You should talk to your health care provider about whether hormone replacement therapy is right for you.  Use sunscreen. Apply sunscreen liberally and repeatedly throughout the day. You should seek shade when your shadow is shorter than you. Protect yourself by wearing long sleeves, pants, a wide-brimmed hat, and sunglasses year round, whenever you are outdoors.  Once a month, do a whole body skin exam, using a mirror to look at the skin on your back. Tell your health care provider of new moles, moles that have irregular borders, moles that  are larger than a pencil eraser, or moles that have changed in shape or color.  Stay current with required vaccines (immunizations).  Influenza vaccine. All adults should be immunized every year.  Tetanus, diphtheria, and acellular pertussis (Td, Tdap) vaccine. Pregnant women should receive 1 dose of Tdap vaccine during each pregnancy. The dose should be obtained regardless of the length of time since the last dose. Immunization is preferred during the 27th-36th week of gestation. An adult who has not previously received Tdap or who does not know her vaccine status should receive 1 dose of Tdap. This initial dose should be followed by tetanus and diphtheria toxoids (Td) booster doses every 10 years. Adults with an unknown or incomplete history of completing a 3-dose immunization series with Td-containing vaccines should begin or complete a primary immunization series including a Tdap dose. Adults should receive a Td booster every 10 years.  Varicella vaccine. An adult without evidence of immunity to varicella should receive 2 doses or a second dose if she has previously received 1 dose. Pregnant females who do not have evidence of immunity should receive the first dose after pregnancy. This first dose should be obtained before leaving the health care facility. The second dose should be obtained 4-8 weeks after the first dose.  Human papillomavirus (HPV) vaccine. Females aged 13-26 years who have not received the vaccine previously should obtain the 3-dose series. The vaccine is not recommended for use in pregnant females. However, pregnancy testing is not needed before receiving a dose. If a female is found to be pregnant after receiving a dose, no treatment is needed. In that case, the remaining doses should be delayed until after the pregnancy. Immunization is recommended for any person with an immunocompromised condition through the age of 47 years if she did not get any or all doses earlier. During the  3-dose series, the second dose should be obtained 4-8 weeks after the first dose. The third dose should be obtained 24 weeks after the first dose and 16 weeks after the second dose.  Zoster vaccine. One dose is recommended for adults aged 17 years or older unless certain conditions are present.  Measles, mumps, and rubella (MMR) vaccine. Adults born  before 1957 generally are considered immune to measles and mumps. Adults born in [birth year] or later should have 1 or more doses of MMR vaccine unless there is a contraindication to the vaccine or there is laboratory evidence of immunity to each of the three diseases. A routine second dose of MMR vaccine should be obtained at least 28 days after the first dose for students attending postsecondary schools, health care workers, or international travelers. People who received inactivated measles vaccine or an unknown type of measles vaccine during 1963-1967 should receive 2 doses of MMR vaccine. People who received inactivated mumps vaccine or an unknown type of mumps vaccine before 1979 and are at high risk for mumps infection should consider immunization with 2 doses of MMR vaccine. For females of childbearing age, rubella immunity should be determined. If there is no evidence of immunity, females who are not pregnant should be vaccinated. If there is no evidence of immunity, females who are pregnant should delay immunization until after pregnancy. Unvaccinated health care workers born before [birth year] who lack laboratory evidence of measles, mumps, or rubella immunity or laboratory confirmation of disease should consider measles and mumps immunization with 2 doses of MMR vaccine or rubella immunization with 1 dose of MMR vaccine.  Pneumococcal 13-valent conjugate (PCV13) vaccine. When indicated, a person who is uncertain of his immunization history and has no record of immunization should receive the PCV13 vaccine. All adults 21 years of age and older should receive this  vaccine. An adult aged 34 years or older who has certain medical conditions and has not been previously immunized should receive 1 dose of PCV13 vaccine. This PCV13 should be followed with a dose of pneumococcal polysaccharide (PPSV23) vaccine. Adults who are at high risk for pneumococcal disease should obtain the PPSV23 vaccine at least 8 weeks after the dose of PCV13 vaccine. Adults older than 27 years of age who have normal immune system function should obtain the PPSV23 vaccine dose at least 1 year after the dose of PCV13 vaccine.  Pneumococcal polysaccharide (PPSV23) vaccine. When PCV13 is also indicated, PCV13 should be obtained first. All adults aged 109 years and older should be immunized. An adult younger than age 71 years who has certain medical conditions should be immunized. Any person who resides in a nursing home or long-term care facility should be immunized. An adult smoker should be immunized. People with an immunocompromised condition and certain other conditions should receive both PCV13 and PPSV23 vaccines. People with human immunodeficiency virus (HIV) infection should be immunized as soon as possible after diagnosis. Immunization during chemotherapy or radiation therapy should be avoided. Routine use of PPSV23 vaccine is not recommended for American Indians, [place] Natives, or people younger than 65 years unless there are medical conditions that require PPSV23 vaccine. When indicated, people who have unknown immunization and have no record of immunization should receive PPSV23 vaccine. One-time revaccination 5 years after the first dose of PPSV23 is recommended for people aged 19-64 years who have chronic kidney failure, nephrotic syndrome, asplenia, or immunocompromised conditions. People who received 1-2 doses of PPSV23 before age 7 years should receive another dose of PPSV23 vaccine at age 15 years or later if at least 5 years have passed since the previous dose. Doses of PPSV23 are not  needed for people immunized with PPSV23 at or after age 51 years.  Meningococcal vaccine. Adults with asplenia or persistent complement component deficiencies should receive 2 doses of quadrivalent meningococcal conjugate (MenACWY-D) vaccine. The doses should be obtained  at least 2 months apart. Microbiologists working with certain meningococcal bacteria, [employer] recruits, people at risk during an outbreak, and people who travel to or live in countries with a high rate of meningitis should be immunized. A first-year college student up through age 31 years who is living in a residence hall should receive a dose if she did not receive a dose on or after her 16th birthday. Adults who have certain high-risk conditions should receive one or more doses of vaccine.  Hepatitis A vaccine. Adults who wish to be protected from this disease, have certain high-risk conditions, work with hepatitis A-infected animals, work in hepatitis A research labs, or travel to or work in countries with a high rate of hepatitis A should be immunized. Adults who were previously unvaccinated and who anticipate close contact with an international adoptee during the first 60 days after arrival in the [place] from a country with a high rate of hepatitis A should be immunized.  Hepatitis B vaccine. Adults who wish to be protected from this disease, have certain high-risk conditions, may be exposed to blood or other infectious body fluids, are household contacts or sex partners of hepatitis B positive people, are clients or workers in certain care facilities, or travel to or work in countries with a high rate of hepatitis B should be immunized.  Haemophilus influenzae type b (Hib) vaccine. A previously unvaccinated person with asplenia or sickle cell disease or having a scheduled splenectomy should receive 1 dose of Hib vaccine. Regardless of previous immunization, a recipient of a hematopoietic stem cell transplant should receive a  3-dose series 6-12 months after her successful transplant. Hib vaccine is not recommended for adults with HIV infection. Preventive Services / Frequency Ages 64 to 62 years  Blood pressure check.** / Every 3-5 years.  Lipid and cholesterol check.** / Every 5 years beginning at age 80.  Clinical breast exam.** / Every 3 years for women in their 21s and 52s.  BRCA-related cancer risk assessment.** / For women who have family members with a BRCA-related cancer (breast, ovarian, tubal, or peritoneal cancers).  Pap test.** / Every 2 years from ages 40 through 49. Every 3 years starting at age 12 through age 25 or 65 with a history of 3 consecutive normal Pap tests.  HPV screening.** / Every 3 years from ages 74 through ages 91 to 61 with a history of 3 consecutive normal Pap tests.  Hepatitis C blood test.** / For any individual with known risks for hepatitis C.  Skin self-exam. / Monthly.  Influenza vaccine. / Every year.  Tetanus, diphtheria, and acellular pertussis (Tdap, Td) vaccine.** / Consult your health care provider. Pregnant women should receive 1 dose of Tdap vaccine during each pregnancy. 1 dose of Td every 10 years.  Varicella vaccine.** / Consult your health care provider. Pregnant females who do not have evidence of immunity should receive the first dose after pregnancy.  HPV vaccine. / 3 doses over 6 months, if 85 and younger. The vaccine is not recommended for use in pregnant females. However, pregnancy testing is not needed before receiving a dose.  Measles, mumps, rubella (MMR) vaccine.** / You need at least 1 dose of MMR if you were born in 1957 or later. You may also need a 2nd dose. For females of childbearing age, rubella immunity should be determined. If there is no evidence of immunity, females who are not pregnant should be vaccinated. If there is no evidence of immunity, females who are  pregnant should delay immunization until after pregnancy.  Pneumococcal  13-valent conjugate (PCV13) vaccine.** / Consult your health care provider.  Pneumococcal polysaccharide (PPSV23) vaccine.** / 1 to 2 doses if you smoke cigarettes or if you have certain conditions.  Meningococcal vaccine.** / 1 dose if you are age 68 to 8 years and a Market researcher living in a residence hall, or have one of several medical conditions, you need to get vaccinated against meningococcal disease. You may also need additional booster doses.  Hepatitis A vaccine.** / Consult your health care provider.  Hepatitis B vaccine.** / Consult your health care provider.  Haemophilus influenzae type b (Hib) vaccine.** / Consult your health care provider. Ages 7 to 53 years  Blood pressure check.** / Every year.  Lipid and cholesterol check.** / Every 5 years beginning at age 25 years.  Lung cancer screening. / Every year if you are aged 11-80 years and have a 30-pack-year history of smoking and currently smoke or have quit within the past 15 years. Yearly screening is stopped once you have quit smoking for at least 15 years or develop a health problem that would prevent you from having lung cancer treatment.  Clinical breast exam.** / Every year after age 48 years.  BRCA-related cancer risk assessment.** / For women who have family members with a BRCA-related cancer (breast, ovarian, tubal, or peritoneal cancers).  Mammogram.** / Every year beginning at age 41 years and continuing for as long as you are in good health. Consult with your health care provider.  Pap test.** / Every 3 years starting at age 65 years through age 37 or 70 years with a history of 3 consecutive normal Pap tests.  HPV screening.** / Every 3 years from ages 72 years through ages 60 to 40 years with a history of 3 consecutive normal Pap tests.  Fecal occult blood test (FOBT) of stool. / Every year beginning at age 21 years and continuing until age 5 years. You may not need to do this test if you get  a colonoscopy every 10 years.  Flexible sigmoidoscopy or colonoscopy.** / Every 5 years for a flexible sigmoidoscopy or every 10 years for a colonoscopy beginning at age 35 years and continuing until age 48 years.  Hepatitis C blood test.** / For all people born from [birth year] through 1963 and any individual with known risks for hepatitis C.  Skin self-exam. / Monthly.  Influenza vaccine. / Every year.  Tetanus, diphtheria, and acellular pertussis (Tdap/Td) vaccine.** / Consult your health care provider. Pregnant women should receive 1 dose of Tdap vaccine during each pregnancy. 1 dose of Td every 10 years.  Varicella vaccine.** / Consult your health care provider. Pregnant females who do not have evidence of immunity should receive the first dose after pregnancy.  Zoster vaccine.** / 1 dose for adults aged 30 years or older.  Measles, mumps, rubella (MMR) vaccine.** / You need at least 1 dose of MMR if you were born in 1957 or later. You may also need a second dose. For females of childbearing age, rubella immunity should be determined. If there is no evidence of immunity, females who are not pregnant should be vaccinated. If there is no evidence of immunity, females who are pregnant should delay immunization until after pregnancy.  Pneumococcal 13-valent conjugate (PCV13) vaccine.** / Consult your health care provider.  Pneumococcal polysaccharide (PPSV23) vaccine.** / 1 to 2 doses if you smoke cigarettes or if you have certain conditions.  Meningococcal vaccine.** /  Consult your health care provider.  Hepatitis A vaccine.** / Consult your health care provider.  Hepatitis B vaccine.** / Consult your health care provider.  Haemophilus influenzae type b (Hib) vaccine.** / Consult your health care provider. Ages 64 years and over  Blood pressure check.** / Every year.  Lipid and cholesterol check.** / Every 5 years beginning at age 23 years.  Lung cancer screening. / Every year if you  are aged 16-80 years and have a 30-pack-year history of smoking and currently smoke or have quit within the past 15 years. Yearly screening is stopped once you have quit smoking for at least 15 years or develop a health problem that would prevent you from having lung cancer treatment.  Clinical breast exam.** / Every year after age 74 years.  BRCA-related cancer risk assessment.** / For women who have family members with a BRCA-related cancer (breast, ovarian, tubal, or peritoneal cancers).  Mammogram.** / Every year beginning at age 44 years and continuing for as long as you are in good health. Consult with your health care provider.  Pap test.** / Every 3 years starting at age 58 years through age 22 or 39 years with 3 consecutive normal Pap tests. Testing can be stopped between 65 and 70 years with 3 consecutive normal Pap tests and no abnormal Pap or HPV tests in the past 10 years.  HPV screening.** / Every 3 years from ages 64 years through ages 70 or 61 years with a history of 3 consecutive normal Pap tests. Testing can be stopped between 65 and 70 years with 3 consecutive normal Pap tests and no abnormal Pap or HPV tests in the past 10 years.  Fecal occult blood test (FOBT) of stool. / Every year beginning at age 40 years and continuing until age 27 years. You may not need to do this test if you get a colonoscopy every 10 years.  Flexible sigmoidoscopy or colonoscopy.** / Every 5 years for a flexible sigmoidoscopy or every 10 years for a colonoscopy beginning at age 7 years and continuing until age 32 years.  Hepatitis C blood test.** / For all people born from [birth year] through 1963 and any individual with known risks for hepatitis C.  Osteoporosis screening.** / A one-time screening for women ages 30 years and over and women at risk for fractures or osteoporosis.  Skin self-exam. / Monthly.  Influenza vaccine. / Every year.  Tetanus, diphtheria, and acellular pertussis (Tdap/Td)  vaccine.** / 1 dose of Td every 10 years.  Varicella vaccine.** / Consult your health care provider.  Zoster vaccine.** / 1 dose for adults aged 35 years or older.  Pneumococcal 13-valent conjugate (PCV13) vaccine.** / Consult your health care provider.  Pneumococcal polysaccharide (PPSV23) vaccine.** / 1 dose for all adults aged 46 years and older.  Meningococcal vaccine.** / Consult your health care provider.  Hepatitis A vaccine.** / Consult your health care provider.  Hepatitis B vaccine.** / Consult your health care provider.  Haemophilus influenzae type b (Hib) vaccine.** / Consult your health care provider. ** Family history and personal history of risk and conditions may change your health care provider's recommendations.   This information is not intended to replace advice given to you by your health care provider. Make sure you discuss any questions you have with your health care provider.   Document Released: 01/14/2002 Document Revised: 12/09/2014 Document Reviewed: 04/15/2011 Elsevier Interactive Patient Education [employer].

## 2015-11-06 NOTE — Progress Notes (Signed)
  Subjective:     Tara [name] is a 29 y.o. female and is here for a comprehensive physical exam. The patient reports no problems.  Social History   Social History  . Marital Status: Married    Spouse Name: N/A  . Number of Children: N/A  . Years of Education: N/A   Occupational History  . Not on file.   Social History Main Topics  . Smoking status: Never Smoker   . Smokeless tobacco: Never Used  . Alcohol Use: No  . Drug Use: No  . Sexual Activity: Yes    Birth Control/ Protection: None   Other Topics Concern  . Not on file   Social History Narrative   Health Maintenance  Topic Date Due  . Janet BerlinETANUS/TDAP  11/06/2007  . PAP SMEAR  11/05/2009  . INFLUENZA VACCINE  07/03/2015  . HIV Screening  Completed    The following portions of the patient's history were reviewed and updated as appropriate: allergies, current medications, past family history, past medical history, past social history, past surgical history and problem list.  Review of Systems Pertinent items noted in HPI and remainder of comprehensive ROS otherwise negative.   Objective:    BP 135/83 mmHg  Pulse 106  Wt 150 lb (68.04 kg)  LMP 10/17/2015  Breastfeeding? No General appearance: alert, cooperative and appears stated age Head: Normocephalic, without obvious abnormality, atraumatic Neck: no adenopathy, supple, symmetrical, trachea midline and thyroid not enlarged, symmetric, no tenderness/mass/nodules Lungs: clear to auscultation bilaterally Breasts: normal appearance, no masses or tenderness Heart: regular rate and rhythm, S1, S2 normal, no murmur, click, rub or gallop Abdomen: soft, non-tender; bowel sounds normal; no masses,  no organomegaly Pelvic: cervix normal in appearance, external genitalia normal, no adnexal masses or tenderness, no cervical motion tenderness, uterus normal size, shape, and consistency and vagina normal without discharge Extremities: extremities normal, atraumatic, no  cyanosis or edema Pulses: 2+ and symmetric Skin: Skin color, texture, turgor normal. No rashes or lesions Lymph nodes: Cervical, supraclavicular, and axillary nodes normal. Neurologic: Grossly normal    Assessment:    Healthy female exam.      Plan:   Problem List Items Addressed This Visit    None    Visit Diagnoses    Encounter for routine gynecological examination    -  Primary    Relevant Orders    TSH    CBC    Lipid panel    Comprehensive metabolic panel    Screening for malignant neoplasm of cervix        Relevant Orders    Pap Lb, rfx HPV ASCU    Counseling for birth control, oral contraceptives        Relevant Medications    norgestimate-ethinyl estradiol (ORTHO-CYCLEN,SPRINTEC,PREVIFEM) 0.25-35 MG-MCG tablet        See After Visit Summary for Counseling Recommendations

## 2015-11-07 LAB — CBC
Hematocrit: 38.8 % (ref 34.0–46.6)
Hemoglobin: 12.9 g/dL (ref 11.1–15.9)
MCH: 30.4 pg (ref 26.6–33.0)
MCHC: 33.2 g/dL (ref 31.5–35.7)
MCV: 92 fL (ref 79–97)
Platelets: 457 10*3/uL — ABNORMAL HIGH (ref 150–379)
RBC: 4.24 x10E6/uL (ref 3.77–5.28)
RDW: 13.1 % (ref 12.3–15.4)
WBC: 6.2 10*3/uL (ref 3.4–10.8)

## 2015-11-07 LAB — COMPREHENSIVE METABOLIC PANEL
ALBUMIN: 4.4 g/dL (ref 3.5–5.5)
ALK PHOS: 99 IU/L (ref 39–117)
ALT: 32 IU/L (ref 0–32)
AST: 27 IU/L (ref 0–40)
Albumin/Globulin Ratio: 1.6 (ref 1.1–2.5)
BUN / CREAT RATIO: 12 (ref 8–20)
BUN: 8 mg/dL (ref 6–20)
Bilirubin Total: <0.2 mg/dL (ref 0.0–1.2)
CALCIUM: 9.2 mg/dL (ref 8.7–10.2)
CO2: 25 mmol/L (ref 18–29)
CREATININE: 0.67 mg/dL (ref 0.57–1.00)
Chloride: 100 mmol/L (ref 97–106)
GFR calc Af Amer: 139 mL/min/{1.73_m2} (ref >59)
GFR, EST NON AFRICAN AMERICAN: 121 mL/min/{1.73_m2} (ref >59)
GLUCOSE: 111 mg/dL — AB (ref 65–99)
Globulin, Total: 2.8 g/dL (ref 1.5–4.5)
Potassium: 3.6 mmol/L (ref 3.5–5.2)
Sodium: 141 mmol/L (ref 136–144)
TOTAL PROTEIN: 7.2 g/dL (ref 6.0–8.5)

## 2015-11-07 LAB — LIPID PANEL
CHOLESTEROL TOTAL: 205 mg/dL — AB (ref 100–199)
Chol/HDL Ratio: 4 ratio units (ref 0.0–4.4)
HDL: 51 mg/dL (ref >39)
LDL Calculated: 127 mg/dL — ABNORMAL HIGH (ref 0–99)
Triglycerides: 135 mg/dL (ref 0–149)
VLDL Cholesterol Cal: 27 mg/dL (ref 5–40)

## 2015-11-07 LAB — TSH: TSH: 1.49 u[IU]/mL (ref 0.450–4.500)

## 2015-11-11 LAB — PAP LB, RFX HPV ASCU: PAP SMEAR COMMENT: 0

## 2015-11-13 ENCOUNTER — Telehealth: Payer: Self-pay | Admitting: *Deleted

## 2015-11-13 NOTE — Telephone Encounter (Signed)
-----   Message from Tanya S Pratt, MD sent at 11/07/2015  9:17 AM EST ----- Labs are ok for not fasting--still waiting on pap 

## 2015-11-13 NOTE — Telephone Encounter (Signed)
Informed pt of pap and lab results.

## 2015-11-13 NOTE — Telephone Encounter (Signed)
Called pt, no answer, left message to call office.

## 2015-11-13 NOTE — Telephone Encounter (Signed)
-----   Message from Reva Boresanya S Pratt, MD sent at 11/07/2015  9:17 AM EST ----- Labs are ok for not fasting--still waiting on pap

## 2016-01-23 ENCOUNTER — Ambulatory Visit (INDEPENDENT_AMBULATORY_CARE_PROVIDER_SITE_OTHER): Admitting: Internal Medicine

## 2016-01-23 ENCOUNTER — Encounter: Payer: Self-pay | Admitting: Internal Medicine

## 2016-01-23 VITALS — BP 124/72 | HR 88 | Temp 99.0°F | Ht 63.33 in | Wt 151.0 lb

## 2016-01-23 DIAGNOSIS — F4310 Post-traumatic stress disorder, unspecified: Secondary | ICD-10-CM

## 2016-01-23 DIAGNOSIS — Z658 Other specified problems related to psychosocial circumstances: Secondary | ICD-10-CM

## 2016-01-23 DIAGNOSIS — F439 Reaction to severe stress, unspecified: Secondary | ICD-10-CM

## 2016-01-23 MED ORDER — ESCITALOPRAM OXALATE 10 MG PO TABS: 10.0000 mg | ORAL_TABLET | Freq: Every day | ORAL | Status: DC

## 2016-01-23 NOTE — Progress Notes (Signed)
Pre visit review using our clinic review tool, if applicable. No additional management support is needed unless otherwise documented below in the visit note. 

## 2016-01-23 NOTE — Patient Instructions (Signed)
Stress and Stress Management Stress is a normal reaction to life events. It is what you feel when life demands more than you are used to or more than you can handle. Some stress can be useful. For example, the stress reaction can help you catch the last bus of the day, study for a test, or meet a deadline at work. But stress that occurs too often or for too long can cause problems. It can affect your emotional health and interfere with relationships and normal daily activities. Too much stress can weaken your immune system and increase your risk for physical illness. If you already have a medical problem, stress can make it worse. CAUSES  All sorts of life events may cause stress. An event that causes stress for one person may not be stressful for another person. Major life events commonly cause stress. These may be positive or negative. Examples include losing your job, moving into a new home, getting married, having a baby, or losing a loved one. Less obvious life events may also cause stress, especially if they occur day after day or in combination. Examples include working long hours, driving in traffic, caring for children, being in debt, or being in a difficult relationship. SIGNS AND SYMPTOMS Stress may cause emotional symptoms including, the following:  Anxiety. This is feeling worried, afraid, on edge, overwhelmed, or out of control.  Anger. This is feeling irritated or impatient.  Depression. This is feeling sad, down, helpless, or guilty.  Difficulty focusing, remembering, or making decisions. Stress may cause physical symptoms, including the following:   Aches and pains. These may affect your head, neck, back, stomach, or other areas of your body.  Tight muscles or clenched jaw.  Low energy or trouble sleeping. Stress may cause unhealthy behaviors, including the following:   Eating to feel better (overeating) or skipping meals.  Sleeping too little, too much, or both.  Working  too much or putting off tasks (procrastination).  Smoking, drinking alcohol, or using drugs to feel better. DIAGNOSIS  Stress is diagnosed through an assessment by your health care provider. Your health care provider will ask questions about your symptoms and any stressful life events.Your health care provider will also ask about your medical history and may order blood tests or other tests. Certain medical conditions and medicine can cause physical symptoms similar to stress. Mental illness can cause emotional symptoms and unhealthy behaviors similar to stress. Your health care provider may refer you to a mental health professional for further evaluation.  TREATMENT  Stress management is the recommended treatment for stress.The goals of stress management are reducing stressful life events and coping with stress in healthy ways.  Techniques for reducing stressful life events include the following:  Stress identification. Self-monitor for stress and identify what causes stress for you. These skills may help you to avoid some stressful events.  Time management. Set your priorities, keep a calendar of events, and learn to say "no." These tools can help you avoid making too many commitments. Techniques for coping with stress include the following:  Rethinking the problem. Try to think realistically about stressful events rather than ignoring them or overreacting. Try to find the positives in a stressful situation rather than focusing on the negatives.  Exercise. Physical exercise can release both physical and emotional tension. The key is to find a form of exercise you enjoy and do it regularly.  Relaxation techniques. These relax the body and mind. Examples include yoga, meditation, tai chi, biofeedback, deep  breathing, progressive muscle relaxation, listening to music, being out in nature, journaling, and other hobbies. Again, the key is to find one or more that you enjoy and can do  regularly.  Healthy lifestyle. Eat a balanced diet, get plenty of sleep, and do not smoke. Avoid using alcohol or drugs to relax.  Strong support network. Spend time with family, friends, or other people you enjoy being around.Express your feelings and talk things over with someone you trust. Counseling or talktherapy with a mental health professional may be helpful if you are having difficulty managing stress on your own. Medicine is typically not recommended for the treatment of stress.Talk to your health care provider if you think you need medicine for symptoms of stress. HOME CARE INSTRUCTIONS  Keep all follow-up visits as directed by your health care provider.  Take all medicines as directed by your health care provider. SEEK MEDICAL CARE IF:  Your symptoms get worse or you start having new symptoms.  You feel overwhelmed by your problems and can no longer manage them on your own. SEEK IMMEDIATE MEDICAL CARE IF:  You feel like hurting yourself or someone else.   This information is not intended to replace advice given to you by your health care provider. Make sure you discuss any questions you have with your health care provider.   Document Released: 05/14/2001 Document Revised: 12/09/2014 Document Reviewed: 07/13/2013 Elsevier Interactive Patient Education 2016 Elsevier Inc.  
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
## 2016-01-24 ENCOUNTER — Telehealth: Payer: Self-pay | Admitting: Internal Medicine

## 2016-01-24 NOTE — Telephone Encounter (Signed)
Left detailed msg on VM per HIPAA  

## 2016-01-24 NOTE — Telephone Encounter (Signed)
Short term disability or FMLA?

## 2016-01-24 NOTE — Telephone Encounter (Signed)
Have her drop off the forms or fax them over

## 2016-01-24 NOTE — Telephone Encounter (Signed)
Pt wanted to know if PCP would complete ppw for short term disability.  Best number to call [phone]

## 2016-01-24 NOTE — Telephone Encounter (Signed)
Patient returned Melanie's call.  Patient said it's short term disability.  You can reach patient at [phone].

## 2016-01-25 NOTE — Telephone Encounter (Signed)
Pt aware and will fax forms over

## 2016-01-26 ENCOUNTER — Other Ambulatory Visit: Payer: Self-pay | Admitting: Internal Medicine

## 2016-01-26 DIAGNOSIS — Z7689 Persons encountering health services in other specified circumstances: Secondary | ICD-10-CM

## 2016-01-26 NOTE — Telephone Encounter (Signed)
Paperwork faxed to [employer] Pt aware  Copy for pt Copy for file Copy for scan Copy for billing

## 2016-01-26 NOTE — Telephone Encounter (Signed)
Paperwork in [facility] IN BOX °For review and signature °

## 2016-01-26 NOTE — Telephone Encounter (Signed)
Mel- Can you call her and see how long she is planning to be out of work. The forms want to know when she stops working and when she plans to go back.d

## 2016-01-26 NOTE — Telephone Encounter (Signed)
Forms done, given back to [place]

## 2016-01-26 NOTE — Telephone Encounter (Signed)
Pt states that the dates on form should be from 01/25/2016 and returning 03/07/2016

## 2016-01-26 NOTE — Telephone Encounter (Signed)
Left detailed msg on VM per HIPAA  

## 2016-01-29 ENCOUNTER — Telehealth: Payer: Self-pay | Admitting: Internal Medicine

## 2016-01-29 NOTE — Telephone Encounter (Signed)
Pt called stating she took her 1st dose of lexapro   On Friday. Friday evening she has blurry vision and this happened Saturday Today she feels very tired.  No problems with vision  Pt only took that 1 dose She wanted to know if there was something else she can take [facility] [address]  Sent to team heal[facility]

## 2016-01-29 NOTE — Telephone Encounter (Signed)
See previous note

## 2016-01-29 NOTE — Telephone Encounter (Signed)
Pt is aware as instructed--expressed understanding 

## 2016-01-29 NOTE — Telephone Encounter (Signed)
[facility]  Patient Name: Tara [name]  DOB: 1986-08-09    Initial Comment Caller states she was prescribed (lexipro), had blurry vision after taking it.   Nurse Assessment  Nurse: Elwyn Lade, RN, Misty Stanley Date/Time (Eastern Time): 01/29/2016 3:31:51 PM  Confirm and document reason for call. If symptomatic, describe symptoms. You must click the next button to save text entered. ---Caller states she was prescribed (Lexipro) 10 mg d/t anxiety, had blurry vision after taking it and is really tired; took med last Friday and hasn't taken it since and was not tired prior to taking med and doesn't feel like doing anything; sleepy, but can't sleep, but is sleeping at night; denies increase in anxiety and not currently feeling anxious; denies depression  Has the patient traveled out of the country within the last 30 days? ---Not Applicable  Does the patient have any new or worsening symptoms? ---Yes  Will a triage be completed? ---Yes  Related visit to physician within the last 2 weeks? ---Yes  Does the PT have any chronic conditions? (i.e. diabetes, asthma, etc.) ---Yes  List chronic conditions. ---anxiety  Is the patient pregnant or possibly pregnant? (Ask all females between the ages of 10-55) ---No  Is this a behavioral health or substance abuse call? ---No     Guidelines    Guideline Title Affirmed Question Affirmed Notes       Final Disposition User        Comments  Used weakness/fatigue guideline for triage, but ALL answers answered with no. Advised she should discuss this with doctor today; understanding verbalized; called office and spoke with Zella Ball, who transferred to [facility], triage nurse at office; call disconnected; called back to office 4 times and call will not go through; advised caller to call back to office in a few minutes , advising that the nurse advised her to speak with her doctor about this within 24 hours, preferably today (per RN judgment)

## 2016-01-29 NOTE — Telephone Encounter (Signed)
See separate 01/29/16 phone note; spoke with pt and she has already received instructions and pt asked me to hold on; held on for 5 mins and disconnected; unable to reach pt again.FYI to Pamala Hurry NP.

## 2016-01-29 NOTE — Telephone Encounter (Signed)
Have her cut it in half daily x 2 weeks then increase to 10 mg daily

## 2016-01-30 ENCOUNTER — Telehealth: Payer: Self-pay | Admitting: Internal Medicine

## 2016-01-30 NOTE — Telephone Encounter (Signed)
Forms just received and given to [facility] to review---will call pt later when I have a moment

## 2016-01-30 NOTE — Telephone Encounter (Signed)
Patient called to speak to [facility].  She wants to know if Shawna Orleans received the short term disability paperwork.

## 2016-01-30 NOTE — Telephone Encounter (Signed)
Pt is aware form received and will be faxed

## 2016-01-31 DIAGNOSIS — Z7689 Persons encountering health services in other specified circumstances: Secondary | ICD-10-CM

## 2016-04-26 ENCOUNTER — Other Ambulatory Visit: Payer: Self-pay | Admitting: Internal Medicine

## 2016-06-12 ENCOUNTER — Other Ambulatory Visit: Payer: Self-pay | Admitting: Internal Medicine

## 2016-09-04 ENCOUNTER — Other Ambulatory Visit: Payer: Self-pay | Admitting: Internal Medicine

## 2016-09-25 ENCOUNTER — Other Ambulatory Visit: Payer: Self-pay | Admitting: Family Medicine

## 2016-09-25 DIAGNOSIS — Z3009 Encounter for other general counseling and advice on contraception: Secondary | ICD-10-CM

## 2016-10-08 ENCOUNTER — Emergency Department
Admission: EM | Admit: 2016-10-08 | Discharge: 2016-10-08 | Disposition: A | Discharge status: Home / Self Care | Attending: Emergency Medicine | Admitting: Emergency Medicine

## 2016-10-08 ENCOUNTER — Encounter: Payer: Self-pay | Admitting: Emergency Medicine

## 2016-10-08 DIAGNOSIS — K0889 Other specified disorders of teeth and supporting structures: Secondary | ICD-10-CM | POA: Diagnosis present

## 2016-10-08 NOTE — Discharge Instructions (Signed)
OPTIONS FOR DENTAL FOLLOW UP CARE ° °[place] Department of Health and Human Services - Local Safety Net Dental Clinics °http://www.ncdhhs.gov/dph/oralhealth/services/safetynetclinics.htm °  °[facility] (336-562-3123) ° °[facility] [place] (919-933-9087) ° °[facility] ([phone] ext [phone]) ° °[facility] ([phone]) ° °[facility] ([phone]) °This clinic caters to the indigent population and is on a lottery system. °Location: °[facility], [address], [place] °Clinic Hours: °Wednesdays from 6pm - 9pm, patients seen by a lottery system. °For dates, call or go to www.med.[facility].edu/[facility]/patients/Dental-[facility] °Services: °Cleanings, fillings and simple extractions. °Payment Options: °DENTAL WORK IS FREE OF CHARGE. Bring proof of income or support. °Best way to get seen: °Arrive at 5:15 pm - this is a lottery, NOT first come/first serve, so arriving earlier will not increase your chances of being seen. °  °  °[facility] °[phone] °Select option 1 for emergencies °  °Location: °[facility], [address], [place] °Clinic Hours: °No walk-ins accepted - call the day before to schedule an appointment. °Check in times are 9:30 am and 1:30 pm. °Services: °Simple extractions, temporary fillings, pulpectomy/pulp debridement, uncomplicated abscess drainage. °Payment Options: °PAYMENT IS DUE AT THE TIME OF SERVICE.  Fee is usually $100-200, additional surgical procedures (e.g. abscess drainage) may be extra. °Cash, checks, Visa/MasterCard accepted.  Can file Medicaid if patient is covered for dental - patient should call case worker to check. °No discount for [facility] patients. °Best way to get seen: °MUST call the day before and get onto the schedule. Can usually be seen the next 1-2 days. No walk-ins accepted. °  °  °[facility] °[phone] °   °Location: °[facility], [address], [place] °Clinic Hours: °M, W, Th, F 8am or 1:30pm, Tues 9a or 1:30 - first come/first served. °Services: °Simple extractions, temporary fillings, uncomplicated abscess drainage.  You do not need to be an [place] resident. °Payment Options: °PAYMENT IS DUE AT THE TIME OF SERVICE. °Dental insurance, otherwise sliding scale - bring proof of income or support. °Depending on income and treatment needed, cost is usually $50-200. °Best way to get seen: °Arrive early as it is first come/first served. °  °  °[facility] °[phone] °  °Location: °[address] °Clinic Hours: °Mon-Thu 8a-5p °Services: °Most basic dental services including extractions and fillings. °Payment Options: °PAYMENT IS DUE AT THE TIME OF SERVICE. °Sliding scale, up to 50% off - bring proof if income or support. °Medicaid with dental option accepted. °Best way to get seen: °Call to schedule an appointment, can usually be seen within 2 weeks OR they will try to see walk-ins - show up at 8a or 2p (you may have to wait). °  °  °[facility] °[phone] °[place] RESIDENTS ONLY °  °Location: °[facility], [address], [place], [place] [address] °Clinic Hours: By appointment only. °Monday - Thursday 8am-5pm, Friday 8am-12pm °Services: Cleanings, fillings, extractions. °Payment Options: °PAYMENT IS DUE AT THE TIME OF SERVICE. °Cash, Visa or MasterCard. Sliding scale - $30 minimum per service. °Best way to get seen: °Come in to office, complete packet and make an appointment - need proof of income °or support monies for each household member and proof of [place] residence. °Usually takes about a month to get in. °  °  °[facility] °[phone] °  °Location: °[address]   [place] °Clinic Hours: Walk-in Urgent Care Dental Services are offered Monday-Friday  mornings only. °The numbers of emergencies accepted daily is limited to the number of °providers available. °Maximum 15 - Mondays, Wednesdays & Thursdays °Maximum 10 - Tuesdays & Fridays °Services: °You do not need to be a [place] resident to be seen for a dental emergency. °Emergencies are defined as pain, swelling, abnormal bleeding, or dental trauma. Walkins will receive x-rays if needed. °NOTE: Dental cleaning is not an emergency. °Payment Options: °PAYMENT IS DUE AT THE TIME OF SERVICE. °Minimum co-pay is $40.00 for uninsured patients. °Minimum co-pay is $3.00 for Medicaid with dental coverage. °Dental Insurance is accepted and must be presented at time of visit. °Medicare does not cover dental. °Forms of payment: Cash, credit card, checks. °Best way to get seen: °If not previously registered with the clinic, walk-in dental registration begins at 7:15 am and is on a first come/first serve basis. °If previously registered with the clinic, call to make an appointment. °  °  °[facility] °[phone] °[place] RESIDENTS ONLY °  °Location: °[address], [place], [place] °Clinic Hours: °Mon-Thu 10a-2p °Services: Extractions only! °Payment Options: °FREE (donations accepted) - bring proof of income or support °Best way to get seen: °Call and schedule an appointment OR come at 8am on the 1st Monday of every month (except for holidays) when it is first come/first served. °  °  °[facility] °[phone] °  °Location: °[address], [place] °Clinic Hours: °Friday mornings °Services, Payment Options, Best way to get seen: °Call for info °

## 2016-10-08 NOTE — ED Provider Notes (Signed)
[facility] Emergency Department Provider Note  ____________________________________________  Time seen: Approximately 9:40 PM  I have reviewed the triage vital signs and the nursing notes.   HISTORY  Chief Complaint Dental Pain    HPI Tara [name] is a 30 y.o. female who presents emergency department complaining of right lower dental pain. Patient states that she has not had any trauma to the area. She denies any swelling. Patient states that the pain is sharp, constant, worse with chewing. Patient denies any fevers or chills, difficult to breathing or swallowing, abdominal pain, nausea or vomiting. Patient is tried over-the-counter medications with no relief. Patient does not have a dentist.   Past Medical History:  Diagnosis Date  . Medical history non-contributory   . Postpartum care following vaginal delivery (6/12) 05/14/2014  . Vaginal Pap smear, abnormal     There are no active problems to display for this patient.   Past Surgical History:  Procedure Laterality Date  . COLPOSCOPY    . NO PAST SURGERIES      Prior to Admission medications   Medication Sig Start Date End Date Taking? Authorizing Provider  escitalopram (LEXAPRO) 10 MG tablet Take 1 tablet (10 mg total) by mouth at bedtime. MUST SCHEDULE ANNUAL EXAM FOR FURTHER REFILLS 09/04/16   Lorre Munroeegina W Baity, NP  SPRINTEC 28 0.25-35 MG-MCG tablet TAKE ONE TABLET BY MOUTH ONCE DAILY 09/25/16   Levie HeritageJacob J Stinson, DO    Allergies Patient has no known allergies.  Family History  Problem Relation Age of Onset  . Hypertension Mother   . Arthritis Maternal Grandmother   . Alcohol abuse Neg Hx   . Asthma Neg Hx   . Birth defects Neg Hx   . Cancer Neg Hx   . COPD Neg Hx   . Diabetes Neg Hx   . Depression Neg Hx   . Drug abuse Neg Hx   . Early death Neg Hx   . Hearing loss Neg Hx   . Heart disease Neg Hx   . Hyperlipidemia Neg Hx   . Kidney disease Neg Hx   . Learning disabilities Neg  Hx   . Mental illness Neg Hx   . Mental retardation Neg Hx   . Miscarriages / Stillbirths Neg Hx   . Stroke Neg Hx   . Vision loss Neg Hx   . Varicose Veins Neg Hx     Social History Social History  Substance Use Topics  . Smoking status: Never Smoker  . Smokeless tobacco: Never Used  . Alcohol use No     Review of Systems  Constitutional: No fever/chills Eyes: No visual changes. No discharge ENT: Positive for right lower dental pain Cardiovascular: no chest pain. Respiratory: no cough. No SOB. Musculoskeletal: Negative for musculoskeletal pain. Skin: Negative for rash, abrasions, lacerations, ecchymosis. Neurological: Negative for headaches, focal weakness or numbness. 10-point ROS otherwise negative.  ____________________________________________   PHYSICAL EXAM:  VITAL SIGNS: ED Triage Vitals [10/08/16 2046]  Enc Vitals Group     BP (!) 141/94     Pulse Rate 98     Resp 18     Temp 98.4 F (36.9 C)     Temp Source Oral     SpO2 100 %     Weight 153 lb (69.4 kg)     Height 5\' 3"  (1.6 m)     Head Circumference      Peak Flow      Pain Score 10     Pain  Loc      Pain Edu?      Excl. in GC?      Constitutional: Alert and oriented. Well appearing and in no acute distress. Eyes: Conjunctivae are normal. PERRL. EOMI. Head: Atraumatic. ENT:      Ears:       Nose: No congestion/rhinnorhea.      Mouth/Throat: Mucous membranes are moist. All dentition intact. No broken dentition. No obvious caries. Patient has tenderness to palpation over the second molar right lower dentition. No surrounding erythema or edema. No drainage. Dentition is not loose. Neck: No stridor.   Hematological/Lymphatic/Immunilogical: No cervical lymphadenopathy. Cardiovascular: Normal rate, regular rhythm. Normal S1 and S2.  Good peripheral circulation. Respiratory: Normal respiratory effort without tachypnea or retractions. Lungs CTAB. Good air entry to the bases with no decreased or  absent breath sounds. Musculoskeletal: Full range of motion to all extremities. No gross deformities appreciated. Neurologic:  Normal speech and language. No gross focal neurologic deficits are appreciated.  Skin:  Skin is warm, dry and intact. No rash noted. Psychiatric: Mood and affect are normal. Speech and behavior are normal. Patient exhibits appropriate insight and judgement.   ____________________________________________   LABS (all labs ordered are listed, but only abnormal results are displayed)  Labs Reviewed - No data to display ____________________________________________  EKG   ____________________________________________  RADIOLOGY   No results found.  ____________________________________________    PROCEDURES  Procedure(s) performed:    Procedures    Medications - No data to display   ____________________________________________   INITIAL IMPRESSION / ASSESSMENT AND PLAN / ED COURSE  Pertinent labs & imaging results that were available during my care of the patient were reviewed by me and considered in my medical decision making (see chart for details).  Review of the  CSRS was performed in accordance of the [place] prior to dispensing any controlled drugs.  Clinical Course     Patient's diagnosis is consistent with Dental pain. No signs of infection. Patient is offered dental block to the emergency department and declined at this time.. She may take Tylenol and Motrin as needed at home. Patient is given a list of dental options for follow-up. Patient is given ED precautions to return to the ED for any worsening or new symptoms.     ____________________________________________  FINAL CLINICAL IMPRESSION(S) / ED DIAGNOSES  Final diagnoses:  Tooth pain      NEW MEDICATIONS STARTED DURING THIS VISIT:  New Prescriptions   No medications on file        This chart was dictated using voice recognition software/Dragon. Despite best  efforts to proofread, errors can occur which can change the meaning. Any change was purely unintentional.    Racheal PatchesJonathan D Verbon Giangregorio, PA-C 10/08/16 2152    Nita Sicklearolina Veronese, MD 10/08/16 2348

## 2016-10-08 NOTE — ED Triage Notes (Signed)
Pt in with co toothache, right earache and headache x 2 days.

## 2016-10-23 ENCOUNTER — Encounter: Admitting: Internal Medicine

## 2016-10-23 ENCOUNTER — Telehealth: Payer: Self-pay | Admitting: *Deleted

## 2016-10-23 DIAGNOSIS — Z0289 Encounter for other administrative examinations: Secondary | ICD-10-CM

## 2016-10-23 NOTE — Telephone Encounter (Signed)
PT called because she had car trouble. She missed her appointment at 1:15 pm. Please advise if you would like her to be removed from the schedule or not. She is rescheduled for 12/12.

## 2016-10-23 NOTE — Telephone Encounter (Signed)
Please leave on the schedule

## 2016-11-04 ENCOUNTER — Telehealth: Payer: Self-pay | Admitting: *Deleted

## 2016-11-04 NOTE — Telephone Encounter (Signed)
LM on patient VM to call and reschedule annual exam for later date - provider has meetings this day

## 2016-11-12 ENCOUNTER — Encounter: Admitting: Internal Medicine

## 2016-11-14 ENCOUNTER — Ambulatory Visit: Admitting: Family Medicine

## 2016-12-17 ENCOUNTER — Encounter: Payer: Self-pay | Admitting: Family Medicine

## 2016-12-17 ENCOUNTER — Ambulatory Visit (INDEPENDENT_AMBULATORY_CARE_PROVIDER_SITE_OTHER): Admitting: Family Medicine

## 2016-12-17 VITALS — BP 113/75 | HR 91 | Ht 63.5 in | Wt 157.0 lb

## 2016-12-17 DIAGNOSIS — N920 Excessive and frequent menstruation with regular cycle: Secondary | ICD-10-CM

## 2016-12-17 HISTORY — DX: Excessive and frequent menstruation with regular cycle: N92.0

## 2016-12-17 LAB — TSH: TSH: 0.74 m[IU]/L

## 2016-12-17 NOTE — Progress Notes (Signed)
Pt states she has bleeding for 1 month with variations in flow from light to extreme heavy with clotting.

## 2016-12-17 NOTE — Assessment & Plan Note (Signed)
The patient may have a bad pill pack. Will check TSH, and give sample of Taytulla to see if this stops her bleeding.

## 2016-12-17 NOTE — Patient Instructions (Signed)
Abnormal Uterine Bleeding Abnormal uterine bleeding means bleeding from the vagina that is not your normal menstrual period. This can be:  Bleeding or spotting between periods.  Bleeding after sex (sexual intercourse).  Bleeding that is heavier or more than normal.  Periods that last longer than usual.  Bleeding after menopause. There are many problems that may cause this. Treatment will depend on the cause of the bleeding. Any kind of bleeding that is not normal should be reviewed by your doctor. Follow these instructions at home: Watch your condition for any changes. These actions may lessen any discomfort you are having:  Do not use tampons or douches as told by your doctor.  Change your pads often. You should get regular pelvic exams and Pap tests. Keep all appointments for tests as told by your doctor. Contact a doctor if:  You are bleeding for more than 1 week.  You feel dizzy at times. Get help right away if:  You pass out.  You have to change pads every 15 to 30 minutes.  You have belly pain.  You have a fever.  You become sweaty or weak.  You are passing large blood clots from the vagina.  You feel sick to your stomach (nauseous) and throw up (vomit). This information is not intended to replace advice given to you by your health care provider. Make sure you discuss any questions you have with your health care provider. Document Released: 09/15/2009 Document Revised: 04/25/2016 Document Reviewed: 06/17/2013 Elsevier Interactive Patient Education  2017 Elsevier Inc.  

## 2016-12-17 NOTE — Progress Notes (Signed)
    Subjective:    Patient ID: Tara [name] is a 31 y.o. female presenting with Dysmenorrhea  on 12/17/2016  HPI: On Sprintec and had regular cycles and no issues. She then started her cycle last month and she has not stopped bleeding since that time. She did start her new pill pack and she has not stopped bleeding. She is on her 3rd week now. Previously on her pills with no concerns and no bleeding. Has not done a UPT but is not missing doses. Denies vaginal discharge or new partner. 3lb weight gain.  Review of Systems  Constitutional: Negative for chills and fever.  Respiratory: Negative for shortness of breath.   Cardiovascular: Negative for chest pain.  Gastrointestinal: Negative for abdominal pain, nausea and vomiting.  Genitourinary: Negative for dysuria.  Skin: Negative for rash.      Objective:    BP 113/75   Pulse 91   Ht 5' 3.5" (1.613 m)   Wt 157 lb (71.2 kg)   LMP 11/16/2016   BMI 27.38 kg/m  Physical Exam  Constitutional: She is oriented to person, place, and time. She appears well-developed and well-nourished. No distress.  HENT:  Head: Normocephalic and atraumatic.  Eyes: No scleral icterus.  Neck: Neck supple.  Cardiovascular: Normal rate.   Pulmonary/Chest: Effort normal.  Abdominal: Soft.  Neurological: She is alert and oriented to person, place, and time.  Skin: Skin is warm and dry.  Psychiatric: She has a normal mood and affect.        Assessment & Plan:   Problem List Items Addressed This Visit      Unprioritized   Menorrhagia with regular cycle - Primary    The patient may have a bad pill pack. Will check TSH, and give sample of Taytulla to see if this stops her bleeding.      Relevant Orders   TSH   B-HCG Quant      Total face-to-face time with patient: 15 minutes. Over 50% of encounter was spent on counseling and coordination of care. Return in about 3 months (around 03/17/2017).  Reva Boresanya S Kristina Mcnorton 12/17/2016 3:21 PM

## 2016-12-18 LAB — HCG, QUANTITATIVE, PREGNANCY

## 2017-01-06 ENCOUNTER — Telehealth: Payer: Self-pay | Admitting: *Deleted

## 2017-01-06 DIAGNOSIS — N920 Excessive and frequent menstruation with regular cycle: Secondary | ICD-10-CM

## 2017-01-06 MED ORDER — NORETHIN ACE-ETH ESTRAD-FE 1-20 MG-MCG PO TABS: 1.0000 | ORAL_TABLET | Freq: Every day | ORAL | 3 refills | Status: DC

## 2017-01-06 NOTE — Telephone Encounter (Signed)
Informed pt of normal results, states Taytulla helped bleeding and would like the generic to be sent to her pharmacy, sent new rx to pharmacy.

## 2017-01-06 NOTE — Telephone Encounter (Signed)
-----   Message from Lindell SparHeather L Bacon, [place] sent at 01/06/2017 10:42 AM EST ----- Regarding: wants to know lab result Contact: [phone] Pt call ed and stated that she was here on 12/18/15, would like to get results from lab.   Thanks

## 2017-03-19 ENCOUNTER — Ambulatory Visit: Admitting: Family Medicine

## 2017-03-19 DIAGNOSIS — Z01419 Encounter for gynecological examination (general) (routine) without abnormal findings: Secondary | ICD-10-CM

## 2017-06-12 ENCOUNTER — Other Ambulatory Visit (HOSPITAL_COMMUNITY)
Admission: RE | Admit: 2017-06-12 | Discharge: 2017-06-12 | Disposition: A | Discharge status: Home / Self Care | Source: Ambulatory Visit | Attending: Obstetrics & Gynecology | Admitting: Obstetrics & Gynecology

## 2017-06-12 ENCOUNTER — Other Ambulatory Visit (INDEPENDENT_AMBULATORY_CARE_PROVIDER_SITE_OTHER): Admitting: *Deleted

## 2017-06-12 DIAGNOSIS — Z113 Encounter for screening for infections with a predominantly sexual mode of transmission: Secondary | ICD-10-CM | POA: Diagnosis not present

## 2017-06-12 NOTE — Progress Notes (Signed)
Pt here today requesting an STD screen.  Currently not c/o of any discharge or problems.  States that her and her spouse seperated for a few months and decided to get back together but he did not disclose that he had another partner until after they were sexually active.  Would like an STD panel drawn. Will call pt with results when available.

## 2017-06-13 LAB — CERVICOVAGINAL ANCILLARY ONLY
Chlamydia: NEGATIVE
Neisseria Gonorrhea: NEGATIVE
Trichomonas: NEGATIVE

## 2017-06-13 LAB — HEPATITIS C ANTIBODY

## 2017-06-13 LAB — HEPATITIS B SURFACE ANTIGEN: Hepatitis B Surface Ag: NEGATIVE

## 2017-06-13 LAB — RPR: RPR Ser Ql: NONREACTIVE

## 2017-06-13 LAB — HIV ANTIBODY (ROUTINE TESTING W REFLEX): HIV SCREEN 4TH GENERATION: NONREACTIVE

## 2017-06-19 ENCOUNTER — Telehealth: Payer: Self-pay | Admitting: *Deleted

## 2017-06-19 NOTE — Telephone Encounter (Signed)
-----   Message from Lindell SparHeather L Bacon, [place] sent at 06/19/2017  1:17 PM EDT ----- Regarding: lab results Contact: [phone] Pt would like call back for results from labs drawn last week

## 2017-06-19 NOTE — Telephone Encounter (Signed)
Informed pt of normal test results, no further questions at this time.

## 2017-12-02 NOTE — L&D Delivery Note (Addendum)
Patient: Tara [name] MRN: [id number]  GBS status: Negative  Patient is a 32 y.o. now G5P5006 s/p NSVD at 6542w0d, who was admitted for SOL with di-di twins. SROM 11h 9331m prior to delivery with clear fluid.      Jerline PainMcClendon, GirlA Daneli [[id number]]  Delivery Note At 11:45 PM a viable female was delivered via Vaginal, Spontaneous (Presentation: ROA ).  APGAR: 9, 9; weight pending.   Placenta status: spontaneous, intact .  Cord:  3 vessel with the following complications: none.        Lorenza CambridgeMcClendon, GirlB Lizza [[id number]]  Delivery Note At 12:03 AM a viable female was delivered via  (Presentation: direct OP ).  APGAR: 9, ;9 weight pending.   Placenta status: spontaneous ,intact .  Cord: 3 vessel with the following complications: none.    Baby A delivered ROA. No nuchal cord present. Shoulder and body delivered in usual fashion. Infant with spontaneous cry, placed on mother's abdomen, dried and bulb suctioned. Cord clamped x 2 after 1-minute delay, and cut by family member. Cord blood drawn and cord clamped.    After delivery of Baby A, confirmed vertex presentation of Baby B with bedside sonogram. Baby B descended with maternal pushing. SROM noted to be port wine colored fluid, with large gush of blood, suspect placental abruption. Baby B without decels throughout and HR remained stable. Baby B delivered direct OP.   No nuchal cord present. Shoulder and body delivered in usual fashion.  Infant with spontaneous cry, placed on mother's abdomen, dried and bulb suctioned. Cord clamped x 2 after 1-minute delay, and cut by family member. Cord blood drawn and cord clamped.    Placenta A and B delivered spontaneously with gentle cord traction. Fundus firm with massage and Pitocin. Perineum inspected and found to have small first degree laceration, which was found to be hemostatic.  Anesthesia:  Epidural  Episiotomy:   None  Lacerations:  First degree  Suture Repair: N/A; hemostatic  Est.  Blood Loss (mL): 600  Mom to postpartum.  Baby A and B to Couplet care / Skin to Skin.  De HollingsheadCatherine L Wallace 07/15/2018, 12:28 AM

## 2017-12-29 ENCOUNTER — Ambulatory Visit (INDEPENDENT_AMBULATORY_CARE_PROVIDER_SITE_OTHER): Payer: Managed Care, Other (non HMO) | Admitting: Obstetrics & Gynecology

## 2017-12-29 ENCOUNTER — Other Ambulatory Visit (HOSPITAL_COMMUNITY)
Admission: RE | Admit: 2017-12-29 | Discharge: 2017-12-29 | Disposition: A | Discharge status: Home / Self Care | Payer: Managed Care, Other (non HMO) | Source: Ambulatory Visit | Attending: Obstetrics & Gynecology | Admitting: Obstetrics & Gynecology

## 2017-12-29 ENCOUNTER — Encounter: Payer: Self-pay | Admitting: Radiology

## 2017-12-29 ENCOUNTER — Encounter: Payer: Self-pay | Admitting: *Deleted

## 2017-12-29 ENCOUNTER — Encounter: Payer: Self-pay | Admitting: Obstetrics & Gynecology

## 2017-12-29 VITALS — BP 135/90 | HR 132 | Wt 147.0 lb

## 2017-12-29 DIAGNOSIS — Z6791 Unspecified blood type, Rh negative: Secondary | ICD-10-CM

## 2017-12-29 DIAGNOSIS — Z3A09 9 weeks gestation of pregnancy: Secondary | ICD-10-CM | POA: Insufficient documentation

## 2017-12-29 DIAGNOSIS — O0990 Supervision of high risk pregnancy, unspecified, unspecified trimester: Secondary | ICD-10-CM | POA: Diagnosis present

## 2017-12-29 DIAGNOSIS — O26899 Other specified pregnancy related conditions, unspecified trimester: Secondary | ICD-10-CM | POA: Insufficient documentation

## 2017-12-29 DIAGNOSIS — O0991 Supervision of high risk pregnancy, unspecified, first trimester: Secondary | ICD-10-CM | POA: Insufficient documentation

## 2017-12-29 DIAGNOSIS — O30049 Twin pregnancy, dichorionic/diamniotic, unspecified trimester: Secondary | ICD-10-CM | POA: Insufficient documentation

## 2017-12-29 DIAGNOSIS — O09891 Supervision of other high risk pregnancies, first trimester: Secondary | ICD-10-CM

## 2017-12-29 DIAGNOSIS — O30041 Twin pregnancy, dichorionic/diamniotic, first trimester: Secondary | ICD-10-CM

## 2017-12-29 MED ORDER — ASPIRIN EC 81 MG PO TBEC
81.0000 mg | DELAYED_RELEASE_TABLET | Freq: Every day | ORAL | 2 refills | Status: DC
Start: 2017-12-29 — End: 2018-07-16

## 2017-12-29 MED ORDER — PRENATAL VITAMINS 0.8 MG PO TABS: 1.0000 | ORAL_TABLET | Freq: Every day | ORAL | 12 refills | Status: DC

## 2017-12-29 NOTE — Progress Notes (Signed)
Last pap 11/06/15  DATING AND VIABILITY SONOGRAM   Tara [name] is a 32 y.o. year old [id number] with LMP Patient's last menstrual period was 10/22/2017. which would correlate to  7457w5d weeks gestation.  She has irregular menstrual cycles.   She is here today for a confirmatory initial sonogram.    GESTATION: Twin Pregnancy     FETAL ACTIVITY:          Heart rate: Baby A - 170bpm Baby B - 178bpm          The fetus is active.  GESTATIONAL AGE AND  BIOMETRICS:  Gestational criteria: Estimated Date of Delivery: 07/29/18 by LMP now at 3857w5d  Previous Scans:0  Baby A - CROWN RUMP LENGTH 2.47cm 7845w1d  Baby B - CROWN RUMP LENGTH 2.44cm 7045w1d                                       AVERAGE EGA(BY THIS SCAN):  9.1 weeks  WORKING EDD( LMP ):  07/29/18     TECHNICIAN COMMENTS:  Twin pregnancy with each fetus measuring 5945w1d by CRL with FHR of 170bpm on Baby A and 178bpm on Baby B   A copy of this report including all images has been saved and backed up to a second source for retrieval if needed. All measures and details of the anatomical scan, placentation, fluid volume and pelvic anatomy are contained in that report.  David Towson 12/29/2017 2:24 PM

## 2017-12-29 NOTE — Addendum Note (Signed)
Addended by: Cheree DittoGRAHAM, Lillieann Pavlich A on: 12/29/2017 04:22 PM   Modules accepted: Orders

## 2017-12-29 NOTE — Progress Notes (Signed)
Subjective:   Tara [name] is a 32 y.o. [phone] with likely dichorionic, diamniotic twin gestation at 4511w5d by LMP and ultrasound done here today being seen today for her first obstetrical visit.  Her obstetrical history is significant for four term SVDs. Patient does not intend to breast feed. Pregnancy history fully reviewed.  Patient reports no complaints.  HISTORY: Obstetric History   G5   P4   T4   P0   A0   L4    SAB0   TAB0   Ectopic0   Multiple0   Live Births4     # Outcome Date GA Lbr Len/2nd Weight Sex Delivery Anes PTL Lv  5 Current           4 Term 05/13/14 3813w0d / 00:06 8 lb 3.6 oz (3.731 kg) M Vag-Spont EPI  LIV     Name: Jolene ProvostMCCLENDON,BOY Hala     Apgar1:  9                Apgar5: 9  3 Term 2013 6013w0d  7 lb (3.175 kg) M Vag-Spont   LIV  2 Term 09/04/09 8066w0d  6 lb 15 oz (3.147 kg) M Vag-Spont EPI  LIV  1 Term 08/04/08 3766w0d  6 lb 15 oz (3.147 kg) F Vag-Spont EPI  LIV     Last pap smear was 11/06/2015 and was normal Past Medical History:  Diagnosis Date  . Medical history non-contributory   . Menorrhagia with regular cycle 12/17/2016  . Postpartum care following vaginal delivery (6/12) 05/14/2014  . Vaginal Pap smear, abnormal    Past Surgical History:  Procedure Laterality Date  . COLPOSCOPY    . NO PAST SURGERIES     Family History  Problem Relation Age of Onset  . Hypertension Mother   . Arthritis Maternal Grandmother   . Alcohol abuse Neg Hx   . Asthma Neg Hx   . Birth defects Neg Hx   . Cancer Neg Hx   . COPD Neg Hx   . Diabetes Neg Hx   . Depression Neg Hx   . Drug abuse Neg Hx   . Early death Neg Hx   . Hearing loss Neg Hx   . Heart disease Neg Hx   . Hyperlipidemia Neg Hx   . Kidney disease Neg Hx   . Learning disabilities Neg Hx   . Mental illness Neg Hx   . Mental retardation Neg Hx   . Miscarriages / Stillbirths Neg Hx   . Stroke Neg Hx   . Vision loss Neg Hx   . Varicose Veins Neg Hx    Social History   Tobacco Use  .  Smoking status: Never Smoker  . Smokeless tobacco: Never Used  Substance Use Topics  . Alcohol use: No    Alcohol/week: 0.0 oz  . Drug use: No   No Known Allergies Current Outpatient Medications on File Prior to Visit  Medication Sig Dispense Refill  . acetaminophen (TYLENOL) 325 MG tablet Take 650 mg by mouth every 6 (six) hours as needed.     No current facility-administered medications on file prior to visit.      Exam   Vitals:   12/29/17 1402  BP: 135/90  Pulse: (!) 132  Weight: 147 lb (66.7 kg)   Fetal Heart Rate (bpm): 170/178  Uterus:     Pelvic Exam: Perineum: no hemorrhoids, normal perineum   Vulva: normal external genitalia, no lesions   Vagina:  normal mucosa,  normal discharge   Cervix: no lesions and normal, pap smear done.    Adnexa: normal adnexa and no mass, fullness, tenderness   Bony Pelvis: average  System: General: well-developed, well-nourished female in no acute distress   Breast:  normal appearance, no masses or tenderness   Skin: normal coloration and turgor, no rashes   Neurologic: oriented, normal, negative, normal mood   Extremities: normal strength, tone, and muscle mass, ROM of all joints is normal   HEENT PERRLA, extraocular movement intact and sclera clear, anicteric   Mouth/Teeth mucous membranes moist, pharynx normal without lesions and dental hygiene good   Neck supple and no masses   Cardiovascular: regular rate and rhythm   Respiratory:  no respiratory distress, normal breath sounds   Abdomen: soft, non-tender; bowel sounds normal; no masses,  no organomegaly     Assessment:   Pregnancy: [id number] Patient Active Problem List   Diagnosis Date Noted  . Supervision of high risk pregnancy, antepartum 12/29/2017  . Rh negative state in antepartum period 12/29/2017  . Dichorionic diamniotic twin pregnancy 12/29/2017     Plan:  1. Dichorionic diamniotic twin pregnancy in first trimester Please see ultrasound report [x]  Aspirin 81  mg daily prescribed after 12 weeks [ ]  Serial growth scans 20-24-28-32-35 [ ]  Antenatal testing starting at 35 weeks [ ]  Delivery by 37 weeks or earlier if needed  Discussed implications of multiple gestation in pregnancy, increased risk of DM, PTL and HTN and other associated maternal-fetal morbidity and mortality, need for antenatal testing and frequent ultrasounds/prenatal visits. ASA prescribed.  2. Rh negative state in antepartum period Will get Rhogam at 28 weeks.  3. Supervision of high risk pregnancy, antepartum Initial labs drawn. Pap done today.  - Obstetric Panel, Including HIV - Culture, OB Urine - GC/Chlamydia probe amp ([place])not at [facility] - Cytology - PAP - Genetic Screening - Hemoglobinopathy evaluation - Cystic Fibrosis Mutation 97 - SMN1 COPY NUMBER ANALYSIS (SMA Carrier Screen) - [place] bedside; Future Continue prenatal vitamins. Genetic Screening discussed, NIPS: ordered. Ultrasound discussed; fetal anatomic survey: to be ordered later. Problem list reviewed and updated. The nature of  - [facility] with multiple MDs and other Advanced Practice Providers was explained to patient; also emphasized that residents, students are part of our team. Routine obstetric precautions reviewed. Return in about 4 weeks (around 01/26/2018) for OB Visit, Panorama blood draw.     Jaynie Collins, MD, FACOG Obstetrician & Gynecologist, [facility] for [employer], [facility]

## 2017-12-30 ENCOUNTER — Encounter: Payer: Self-pay | Admitting: Obstetrics & Gynecology

## 2017-12-30 ENCOUNTER — Encounter (INDEPENDENT_AMBULATORY_CARE_PROVIDER_SITE_OTHER): Payer: Managed Care, Other (non HMO) | Admitting: *Deleted

## 2017-12-30 DIAGNOSIS — O30041 Twin pregnancy, dichorionic/diamniotic, first trimester: Secondary | ICD-10-CM | POA: Diagnosis not present

## 2017-12-30 DIAGNOSIS — R7689 Other specified abnormal immunological findings in serum: Secondary | ICD-10-CM | POA: Insufficient documentation

## 2017-12-30 DIAGNOSIS — R768 Other specified abnormal immunological findings in serum: Secondary | ICD-10-CM | POA: Insufficient documentation

## 2017-12-30 DIAGNOSIS — O09891 Supervision of other high risk pregnancies, first trimester: Secondary | ICD-10-CM

## 2017-12-30 LAB — OBSTETRIC PANEL, INCLUDING HIV
ANTIBODY SCREEN: NEGATIVE
BASOS: 0 %
Basophils Absolute: 0 10*3/uL (ref 0.0–0.2)
EOS (ABSOLUTE): 0 10*3/uL (ref 0.0–0.4)
EOS: 1 %
HEMOGLOBIN: 12.7 g/dL (ref 11.1–15.9)
HEP B S AG: NEGATIVE
HIV SCREEN 4TH GENERATION: NONREACTIVE
Hematocrit: 36.5 % (ref 34.0–46.6)
IMMATURE GRANULOCYTES: 1 %
Immature Grans (Abs): 0 10*3/uL (ref 0.0–0.1)
LYMPHS ABS: 2 10*3/uL (ref 0.7–3.1)
Lymphs: 30 %
MCH: 31.8 pg (ref 26.6–33.0)
MCHC: 34.8 g/dL (ref 31.5–35.7)
MCV: 91 fL (ref 79–97)
Monocytes Absolute: 0.5 10*3/uL (ref 0.1–0.9)
Monocytes: 7 %
NEUTROS ABS: 4.3 10*3/uL (ref 1.4–7.0)
Neutrophils: 61 %
Platelets: 393 10*3/uL — ABNORMAL HIGH (ref 150–379)
RBC: 4 x10E6/uL (ref 3.77–5.28)
RDW: 13.3 % (ref 12.3–15.4)
RH TYPE: NEGATIVE
RPR: REACTIVE — AB
Rubella Antibodies, IGG: 1.27 index (ref >0.99)
WBC: 6.9 10*3/uL (ref 3.4–10.8)

## 2017-12-30 LAB — GC/CHLAMYDIA PROBE AMP (~~LOC~~) NOT AT ARMC
Chlamydia: NEGATIVE
Neisseria Gonorrhea: NEGATIVE

## 2017-12-30 LAB — RPR, QUANT+TP ABS (REFLEX): T Pallidum Abs: NEGATIVE

## 2017-12-31 ENCOUNTER — Encounter: Payer: Self-pay | Admitting: Obstetrics & Gynecology

## 2017-12-31 DIAGNOSIS — R87610 Atypical squamous cells of undetermined significance on cytologic smear of cervix (ASC-US): Secondary | ICD-10-CM

## 2017-12-31 HISTORY — DX: Atypical squamous cells of undetermined significance on cytologic smear of cervix (ASC-US): R87.610

## 2017-12-31 LAB — CULTURE, OB URINE

## 2017-12-31 LAB — CYTOLOGY - PAP
DIAGNOSIS: UNDETERMINED — AB
HPV (WINDOPATH): NOT DETECTED
TRICH (WINDOWPATH): NEGATIVE

## 2017-12-31 LAB — URINE CULTURE, OB REFLEX

## 2018-01-05 LAB — CYSTIC FIBROSIS MUTATION 97: GENE DIS ANAL CARRIER INTERP BLD/T-IMP: NOT DETECTED

## 2018-01-05 LAB — HEMOGLOBINOPATHY EVALUATION
HGB C: 0 %
HGB S: 0 %
HGB VARIANT: 0 %
Hemoglobin A2 Quantitation: 2.6 % (ref 1.8–3.2)
Hemoglobin F Quantitation: 0 % (ref 0.0–2.0)
Hgb A: 97.4 % (ref 96.4–98.8)

## 2018-01-05 LAB — SMN1 COPY NUMBER ANALYSIS (SMA CARRIER SCREENING)

## 2018-01-26 ENCOUNTER — Encounter: Payer: Self-pay | Admitting: Obstetrics & Gynecology

## 2018-01-26 ENCOUNTER — Ambulatory Visit (INDEPENDENT_AMBULATORY_CARE_PROVIDER_SITE_OTHER): Payer: Managed Care, Other (non HMO) | Admitting: Obstetrics & Gynecology

## 2018-01-26 VITALS — BP 114/68 | HR 93 | Wt 148.0 lb

## 2018-01-26 DIAGNOSIS — Z3689 Encounter for other specified antenatal screening: Secondary | ICD-10-CM

## 2018-01-26 DIAGNOSIS — O30009 Twin pregnancy, unspecified number of placenta and unspecified number of amniotic sacs, unspecified trimester: Secondary | ICD-10-CM

## 2018-01-26 DIAGNOSIS — O30041 Twin pregnancy, dichorionic/diamniotic, first trimester: Secondary | ICD-10-CM

## 2018-01-26 DIAGNOSIS — O0990 Supervision of high risk pregnancy, unspecified, unspecified trimester: Secondary | ICD-10-CM

## 2018-01-26 NOTE — Patient Instructions (Signed)
Return to clinic for any scheduled appointments or obstetric concerns, or go to [facility] for evaluation  

## 2018-01-26 NOTE — Progress Notes (Signed)
   PRENATAL VISIT NOTE  Subjective:  Tara [name] is a 32 y.o. G5P4004 at 230w5d being seen today for ongoing prenatal care.  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  Patient reports no complaints.  Contractions: Not present. Vag. Bleeding: None.  Movement: Absent. Denies leaking of fluid.   The following portions of the patient's history were reviewed and updated as appropriate: allergies, current medications, past family history, past medical history, past social history, past surgical history and problem list. Problem list updated.  Objective:   Vitals:   01/26/18 1402  BP: 114/68  Pulse: 93  Weight: 148 lb (67.1 kg)    Fetal Status: Fetal Heart Rate (bpm): 150/156   Movement: Absent     General:  Alert, oriented and cooperative. Patient is in no acute distress.  Skin: Skin is warm and dry. No rash noted.   Cardiovascular: Normal heart rate noted  Respiratory: Normal respiratory effort, no problems with respiration noted  Abdomen: Soft, gravid, appropriate for gestational age.  Pain/Pressure: Absent     Pelvic: Cervical exam deferred        Extremities: Normal range of motion.  Edema: None  Mental Status:  Normal mood and affect. Normal behavior. Normal judgment and thought content.   Assessment and Plan:  Pregnancy: G5P4004 at 330w5d  1. Encounter for ultrasound to assess fetal anatomy and growth in twin pregnancy, antepartum Anatomy scan ordered - US MFM OB DETAIL +14 WK; Future - US MFM OB DETAIL ADDL GEST +14 WK; Future  2. Dichorionic diamniotic twin pregnancy in first trimester  Genetic Screening/NIPS done today.  3. Supervision of high risk pregnancy, antepartum No other complaints  or concerns.  Routine obstetric precautions reviewed. Please refer to After Visit Summary for other counseling recommendations.  Return in about 4 weeks (around 02/23/2018) for OB Visit.   Jaynie CollinsUgonna Saori Umholtz, MD

## 2018-02-03 ENCOUNTER — Encounter: Payer: Self-pay | Admitting: *Deleted

## 2018-02-24 ENCOUNTER — Encounter: Payer: Managed Care, Other (non HMO) | Admitting: Obstetrics & Gynecology

## 2018-02-25 ENCOUNTER — Ambulatory Visit (INDEPENDENT_AMBULATORY_CARE_PROVIDER_SITE_OTHER): Payer: Managed Care, Other (non HMO) | Admitting: Family Medicine

## 2018-02-25 VITALS — BP 125/71 | HR 93 | Wt 158.4 lb

## 2018-02-25 DIAGNOSIS — O0990 Supervision of high risk pregnancy, unspecified, unspecified trimester: Secondary | ICD-10-CM

## 2018-02-25 DIAGNOSIS — O30042 Twin pregnancy, dichorionic/diamniotic, second trimester: Secondary | ICD-10-CM

## 2018-02-25 DIAGNOSIS — K219 Gastro-esophageal reflux disease without esophagitis: Secondary | ICD-10-CM

## 2018-02-25 MED ORDER — OMEPRAZOLE MAGNESIUM 20 MG PO TBEC: 20.0000 mg | DELAYED_RELEASE_TABLET | Freq: Every day | ORAL | 3 refills | Status: DC

## 2018-02-25 NOTE — Progress Notes (Signed)
   PRENATAL VISIT NOTE  Subjective:  Tara [name] is a 32 y.o. G5P4004 at 4973w0d being seen today for ongoing prenatal care.  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  Patient reports heartburn.   .  .  Movement: Present. Denies leaking of fluid.   The following portions of the patient's history were reviewed and updated as appropriate: allergies, current medications, past family history, past medical history, past social history, past surgical history and problem list. Problem list updated.  Objective:   Vitals:   02/25/18 1128  BP: 125/71  Pulse: 93  Weight: 158 lb 6.4 oz (71.8 kg)    Fetal Status: Fetal Heart Rate (bpm): 142/156 Fundal Height: 22 cm Movement: Present     General:  Alert, oriented and cooperative. Patient is in no acute distress.  Skin: Skin is warm and dry. No rash noted.   Cardiovascular: Normal heart rate noted  Respiratory: Normal respiratory effort, no problems with respiration noted  Abdomen: Soft, gravid, appropriate for gestational age.  Pain/Pressure: Absent     Pelvic: Cervical exam deferred        Extremities: Normal range of motion.  Edema: None  Mental Status:  Normal mood and affect. Normal behavior. Normal judgment and thought content.   Assessment and Plan:  Pregnancy: G5P4004 at 9373w0d  1. Supervision of high risk pregnancy, antepartum Continue routine prenatal care.   2. Dichorionic diamniotic twin pregnancy in second trimester For anatomy u/s x 2 next week  3. Gastroesophageal reflux disease without esophagitis Trial of PPI - omeprazole (PRILOSEC OTC) 20 MG tablet; Take 1 tablet (20 mg total) by mouth daily.  Dispense: 30 tablet; Refill: 3  General  obstetric precautions including but not limited to vaginal bleeding, contractions, leaking of fluid and fetal movement were reviewed in detail with the patient. Please refer to After Visit Summary for other counseling recommendations.  Return in 1 month (on 03/25/2018).   Reva Boresanya S Tresean Mattix, MD

## 2018-02-25 NOTE — Patient Instructions (Signed)

## 2018-02-26 ENCOUNTER — Encounter (HOSPITAL_COMMUNITY): Payer: Self-pay | Admitting: Obstetrics & Gynecology

## 2018-02-27 ENCOUNTER — Telehealth: Payer: Self-pay | Admitting: *Deleted

## 2018-02-27 NOTE — Telephone Encounter (Signed)
Changed meds from tabs to capsules per pt insurance. Pt is aware meds at pharmacy

## 2018-02-27 NOTE — Telephone Encounter (Signed)
-----   Message from Lindell SparHeather L Bacon, [place] sent at 02/27/2018  9:30 AM EDT ----- Regarding: PA for rx Contact: [phone] Patient states that pharmacy called her and told her that she needs a PA for her rx ( for heart burn) due to insurance denial. [facility] in [place]

## 2018-03-04 ENCOUNTER — Ambulatory Visit (HOSPITAL_COMMUNITY)
Admission: RE | Admit: 2018-03-04 | Discharge: 2018-03-04 | Disposition: A | Discharge status: Home / Self Care | Payer: Managed Care, Other (non HMO) | Source: Ambulatory Visit | Attending: Obstetrics & Gynecology | Admitting: Obstetrics & Gynecology

## 2018-03-04 ENCOUNTER — Encounter (HOSPITAL_COMMUNITY): Payer: Self-pay

## 2018-03-04 ENCOUNTER — Other Ambulatory Visit (HOSPITAL_COMMUNITY): Payer: Self-pay | Admitting: *Deleted

## 2018-03-04 DIAGNOSIS — O30042 Twin pregnancy, dichorionic/diamniotic, second trimester: Secondary | ICD-10-CM | POA: Insufficient documentation

## 2018-03-04 DIAGNOSIS — Z363 Encounter for antenatal screening for malformations: Secondary | ICD-10-CM | POA: Diagnosis not present

## 2018-03-04 DIAGNOSIS — Z3689 Encounter for other specified antenatal screening: Secondary | ICD-10-CM

## 2018-03-04 DIAGNOSIS — O30049 Twin pregnancy, dichorionic/diamniotic, unspecified trimester: Secondary | ICD-10-CM

## 2018-03-04 DIAGNOSIS — O30009 Twin pregnancy, unspecified number of placenta and unspecified number of amniotic sacs, unspecified trimester: Secondary | ICD-10-CM

## 2018-03-04 DIAGNOSIS — Z3A19 19 weeks gestation of pregnancy: Secondary | ICD-10-CM | POA: Insufficient documentation

## 2018-03-11 ENCOUNTER — Telehealth: Payer: Self-pay

## 2018-03-11 DIAGNOSIS — G8929 Other chronic pain: Secondary | ICD-10-CM

## 2018-03-11 DIAGNOSIS — M545 Low back pain, unspecified: Secondary | ICD-10-CM

## 2018-03-11 MED ORDER — CYCLOBENZAPRINE HCL 10 MG PO TABS: 10.0000 mg | ORAL_TABLET | Freq: Three times a day (TID) | ORAL | 1 refills | Status: DC | PRN

## 2018-03-11 NOTE — Telephone Encounter (Signed)
Pt calls complaining of back pain. Pt is requesting rx for ibuprofen. She states that the tylenol is not helping the pain, and that she needs something to alternate with that.

## 2018-03-11 NOTE — Addendum Note (Signed)
Addended by: Reva BoresPRATT, TANYA S on: 03/11/2018 05:06 PM   Modules accepted: Orders

## 2018-03-17 NOTE — Telephone Encounter (Signed)
Pt informed

## 2018-03-25 ENCOUNTER — Ambulatory Visit (INDEPENDENT_AMBULATORY_CARE_PROVIDER_SITE_OTHER): Payer: Managed Care, Other (non HMO) | Admitting: Family Medicine

## 2018-03-25 VITALS — BP 110/72 | HR 92 | Wt 164.1 lb

## 2018-03-25 DIAGNOSIS — O0990 Supervision of high risk pregnancy, unspecified, unspecified trimester: Secondary | ICD-10-CM

## 2018-03-25 DIAGNOSIS — O0992 Supervision of high risk pregnancy, unspecified, second trimester: Secondary | ICD-10-CM

## 2018-03-25 DIAGNOSIS — B3731 Acute candidiasis of vulva and vagina: Secondary | ICD-10-CM

## 2018-03-25 DIAGNOSIS — O30042 Twin pregnancy, dichorionic/diamniotic, second trimester: Secondary | ICD-10-CM

## 2018-03-25 DIAGNOSIS — B373 Candidiasis of vulva and vagina: Secondary | ICD-10-CM

## 2018-03-25 MED ORDER — TERCONAZOLE 0.8 % VA CREA: 1.0000 | TOPICAL_CREAM | Freq: Every day | VAGINAL | 0 refills | Status: DC

## 2018-03-25 NOTE — Progress Notes (Signed)
   PRENATAL VISIT NOTE  Subjective:  Tara [name] is a 32 y.o. G5P4004 at 10947w0d being seen today for ongoing prenatal care.  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  Patient reports backache, vaginal irritation and hip pain Using a belly band and flexeril (makes her sleepy). Began Prilosec for GERD--has improved greatly. Contractions: Not present. Vag. Bleeding: None.  Movement: Present. Denies leaking of fluid.   The following portions of the patient's history were reviewed and updated as appropriate: allergies, current medications, past family history, past medical history, past social history, past surgical history and problem list. Problem list updated.  Objective:   Vitals:   03/25/18 1023  BP: 110/72  Pulse: 92  Weight: 164 lb 1.6 oz (74.4 kg)    Fetal Status: Fetal Heart Rate (bpm): 156/158   Movement: Present     General:  Alert, oriented and cooperative. Patient is in no acute distress.  Skin: Skin is warm and dry. No rash noted.   Cardiovascular: Normal heart rate noted  Respiratory: Normal respiratory effort, no problems with respiration noted  Abdomen: Soft, gravid, appropriate for gestational age.  Pain/Pressure: Present     Pelvic: Cervical exam deferred        Extremities: Normal range of motion.  Edema: None  Mental Status: Normal mood and affect. Normal behavior. Normal judgment and thought content.   Assessment and Plan:  Pregnancy: G5P4004 at 447w0d  1. Dichorionic diamniotic twin pregnancy in second trimester Has f/u u/s in 1 wks--previously concordant.  2. Supervision of high risk pregnancy, antepartum   3. Yeast vaginitis Following ABx for tooth--needs wisdom tooth pulled  (willnot be done until post delivery) - terconazole (TERAZOL 3) 0.8 % vaginal cream; Place 1 applicator vaginally at bedtime.  Dispense: 20 g; Refill: 0  General obstetric precautions including but not limited to vaginal bleeding, contractions, leaking of fluid and fetal movement were reviewed in detail with the patient. Please refer to After Visit Summary for other counseling recommendations.  Return in 1 month (on 04/22/2018).  Future Appointments  Date Time Provider Department Center  04/03/2018 11:00 AM [facility] US 3 [facility]  04/22/2018 11:00 AM Anyanwu, Jethro BastosUgonna A, MD [facility]    Reva Boresanya S Pratt, MD

## 2018-03-25 NOTE — Patient Instructions (Signed)
 Second Trimester of Pregnancy The second trimester is from week 14 through week 27 (months 4 through 6). The second trimester is often a time when you feel your best. Your body has adjusted to being pregnant, and you begin to feel better physically. Usually, morning sickness has lessened or quit completely, you may have more energy, and you may have an increase in appetite. The second trimester is also a time when the fetus is growing rapidly. At the end of the sixth month, the fetus is about 9 inches long and weighs about 1 pounds. You will likely begin to feel the baby move (quickening) between 16 and 20 weeks of pregnancy. Body changes during your second trimester Your body continues to go through many changes during your second trimester. The changes vary from woman to woman.  Your weight will continue to increase. You will notice your lower abdomen bulging out.  You may begin to get stretch marks on your hips, abdomen, and breasts.  You may develop headaches that can be relieved by medicines. The medicines should be approved by your health care provider.  You may urinate more often because the fetus is pressing on your bladder.  You may develop or continue to have heartburn as a result of your pregnancy.  You may develop constipation because certain hormones are causing the muscles that push waste through your intestines to slow down.  You may develop hemorrhoids or swollen, bulging veins (varicose veins).  You may have back pain. This is caused by: ? Weight gain. ? Pregnancy hormones that are relaxing the joints in your pelvis. ? A shift in weight and the muscles that support your balance.  Your breasts will continue to grow and they will continue to become tender.  Your gums may bleed and may be sensitive to brushing and flossing.  Dark spots or blotches (chloasma, mask of pregnancy) may develop on your face. This will likely fade after the baby is born.  A dark line from  your belly button to the pubic area (linea nigra) may appear. This will likely fade after the baby is born.  You may have changes in your hair. These can include thickening of your hair, rapid growth, and changes in texture. Some women also have hair loss during or after pregnancy, or hair that feels dry or thin. Your hair will most likely return to normal after your baby is born.  What to expect at prenatal visits During a routine prenatal visit:  You will be weighed to make sure you and the fetus are growing normally.  Your blood pressure will be taken.  Your abdomen will be measured to track your baby's growth.  The fetal heartbeat will be listened to.  Any test results from the previous visit will be discussed.  Your health care provider may ask you:  How you are feeling.  If you are feeling the baby move.  If you have had any abnormal symptoms, such as leaking fluid, bleeding, severe headaches, or abdominal cramping.  If you are using any tobacco products, including cigarettes, chewing tobacco, and electronic cigarettes.  If you have any questions.  Other tests that may be performed during your second trimester include:  Blood tests that check for: ? Low iron levels (anemia). ? High blood sugar that affects pregnant women (gestational diabetes) between 24 and 28 weeks. ? Rh antibodies. This is to check for a protein on red blood cells (Rh factor).  Urine tests to check for infections, diabetes,   or protein in the urine.  An ultrasound to confirm the proper growth and development of the baby.  An amniocentesis to check for possible genetic problems.  Fetal screens for spina bifida and Down syndrome.  HIV (human immunodeficiency virus) testing. Routine prenatal testing includes screening for HIV, unless you choose not to have this test.  Follow these instructions at home: Medicines  Follow your health care provider's instructions regarding medicine use. Specific  medicines may be either safe or unsafe to take during pregnancy.  Take a prenatal vitamin that contains at least 600 micrograms (mcg) of folic acid.  If you develop constipation, try taking a stool softener if your health care provider approves. Eating and drinking  Eat a balanced diet that includes fresh fruits and vegetables, whole grains, good sources of protein such as meat, eggs, or tofu, and low-fat dairy. Your health care provider will help you determine the amount of weight gain that is right for you.  Avoid raw meat and uncooked cheese. These carry germs that can cause birth defects in the baby.  If you have low calcium intake from food, talk to your health care provider about whether you should take a daily calcium supplement.  Limit foods that are high in fat and processed sugars, such as fried and sweet foods.  To prevent constipation: ? Drink enough fluid to keep your urine clear or pale yellow. ? Eat foods that are high in fiber, such as fresh fruits and vegetables, whole grains, and beans. Activity  Exercise only as directed by your health care provider. Most women can continue their usual exercise routine during pregnancy. Try to exercise for 30 minutes at least 5 days a week. Stop exercising if you experience uterine contractions.  Avoid heavy lifting, wear low heel shoes, and practice good posture.  A sexual relationship may be continued unless your health care provider directs you otherwise. Relieving pain and discomfort  Wear a good support bra to prevent discomfort from breast tenderness.  Take warm sitz baths to soothe any pain or discomfort caused by hemorrhoids. Use hemorrhoid cream if your health care provider approves.  Rest with your legs elevated if you have leg cramps or low back pain.  If you develop varicose veins, wear support hose. Elevate your feet for 15 minutes, 3-4 times a day. Limit salt in your diet. Prenatal Care  Write down your questions.  Take them to your prenatal visits.  Keep all your prenatal visits as told by your health care provider. This is important. Safety  Wear your seat belt at all times when driving.  Make a list of emergency phone numbers, including numbers for family, friends, the hospital, and police and fire departments. General instructions  Ask your health care provider for a referral to a local prenatal education class. Begin classes no later than the beginning of month 6 of your pregnancy.  Ask for help if you have counseling or nutritional needs during pregnancy. Your health care provider can offer advice or refer you to specialists for help with various needs.  Do not use hot tubs, steam rooms, or saunas.  Do not douche or use tampons or scented sanitary pads.  Do not cross your legs for long periods of time.  Avoid cat litter boxes and soil used by cats. These carry germs that can cause birth defects in the baby and possibly loss of the fetus by miscarriage or stillbirth.  Avoid all smoking, herbs, alcohol, and unprescribed drugs. Chemicals in these products   can affect the formation and growth of the baby.  Do not use any products that contain nicotine or tobacco, such as cigarettes and e-cigarettes. If you need help quitting, ask your health care provider.  Visit your dentist if you have not gone yet during your pregnancy. Use a soft toothbrush to brush your teeth and be gentle when you floss. Contact a health care provider if:  You have dizziness.  You have mild pelvic cramps, pelvic pressure, or nagging pain in the abdominal area.  You have persistent nausea, vomiting, or diarrhea.  You have a bad smelling vaginal discharge.  You have pain when you urinate. Get help right away if:  You have a fever.  You are leaking fluid from your vagina.  You have spotting or bleeding from your vagina.  You have severe abdominal cramping or pain.  You have rapid weight gain or weight  loss.  You have shortness of breath with chest pain.  You notice sudden or extreme swelling of your face, hands, ankles, feet, or legs.  You have not felt your baby move in over an hour.  You have severe headaches that do not go away when you take medicine.  You have vision changes. Summary  The second trimester is from week 14 through week 27 (months 4 through 6). It is also a time when the fetus is growing rapidly.  Your body goes through many changes during pregnancy. The changes vary from woman to woman.  Avoid all smoking, herbs, alcohol, and unprescribed drugs. These chemicals affect the formation and growth your baby.  Do not use any tobacco products, such as cigarettes, chewing tobacco, and e-cigarettes. If you need help quitting, ask your health care provider.  Contact your health care provider if you have any questions. Keep all prenatal visits as told by your health care provider. This is important. This information is not intended to replace advice given to you by your health care provider. Make sure you discuss any questions you have with your health care provider. Document Released: 11/12/2001 Document Revised: 12/24/2016 Document Reviewed: 12/24/2016 [employer] Interactive Patient Education  2018 [employer]   Contraception Choices Contraception, also called birth control, refers to methods or devices that prevent pregnancy. Hormonal methods Contraceptive implant A contraceptive implant is a thin, plastic tube that contains a hormone. It is inserted into the upper part of the arm. It can remain in place for up to 3 years. Progestin-only injections Progestin-only injections are injections of progestin, a synthetic form of the hormone progesterone. They are given every 3 months by a health care provider. Birth control pills Birth control pills are pills that contain hormones that prevent pregnancy. They must be taken once a day, preferably at the same time each  day. Birth control patch The birth control patch contains hormones that prevent pregnancy. It is placed on the skin and must be changed once a week for three weeks and removed on the fourth week. A prescription is needed to use this method of contraception. Vaginal ring A vaginal ring contains hormones that prevent pregnancy. It is placed in the vagina for three weeks and removed on the fourth week. After that, the process is repeated with a new ring. A prescription is needed to use this method of contraception. Emergency contraceptive Emergency contraceptives prevent pregnancy after unprotected sex. They come in pill form and can be taken up to 5 days after sex. They work best the sooner they are taken after having sex. Most emergency   contraceptives are available without a prescription. This method should not be used as your only form of birth control. Barrier methods Female condom A female condom is a thin sheath that is worn over the penis during sex. Condoms keep sperm from going inside a woman's body. They can be used with a spermicide to increase their effectiveness. They should be disposed after a single use. Female condom A female condom is a soft, loose-fitting sheath that is put into the vagina before sex. The condom keeps sperm from going inside a woman's body. They should be disposed after a single use. Diaphragm A diaphragm is a soft, dome-shaped barrier. It is inserted into the vagina before sex, along with a spermicide. The diaphragm blocks sperm from entering the uterus, and the spermicide kills sperm. A diaphragm should be left in the vagina for 6-8 hours after sex and removed within 24 hours. A diaphragm is prescribed and fitted by a health care provider. A diaphragm should be replaced every 1-2 years, after giving birth, after gaining more than 15 lb (6.8 kg), and after pelvic surgery. Cervical cap A cervical cap is a round, soft latex or plastic cup that fits over the cervix. It is  inserted into the vagina before sex, along with spermicide. It blocks sperm from entering the uterus. The cap should be left in place for 6-8 hours after sex and removed within 48 hours. A cervical cap must be prescribed and fitted by a health care provider. It should be replaced every 2 years. Sponge A sponge is a soft, circular piece of polyurethane foam with spermicide on it. The sponge helps block sperm from entering the uterus, and the spermicide kills sperm. To use it, you make it wet and then insert it into the vagina. It should be inserted before sex, left in for at least 6 hours after sex, and removed and thrown away within 30 hours. Spermicides Spermicides are chemicals that kill or block sperm from entering the cervix and uterus. They can come as a cream, jelly, suppository, foam, or tablet. A spermicide should be inserted into the vagina with an applicator at least 10-15 minutes before sex to allow time for it to work. The process must be repeated every time you have sex. Spermicides do not require a prescription. Intrauterine contraception Intrauterine device (IUD) An IUD is a T-shaped device that is put in a woman's uterus. There are two types:  Hormone IUD.This type contains progestin, a synthetic form of the hormone progesterone. This type can stay in place for 3-5 years.  Copper IUD.This type is wrapped in copper wire. It can stay in place for 10 years.  Permanent methods of contraception Female tubal ligation In this method, a woman's fallopian tubes are sealed, tied, or blocked during surgery to prevent eggs from traveling to the uterus. Hysteroscopic sterilization In this method, a small, flexible insert is placed into each fallopian tube. The inserts cause scar tissue to form in the fallopian tubes and block them, so sperm cannot reach an egg. The procedure takes about 3 months to be effective. Another form of birth control must be used during those 3 months. Female  sterilization This is a procedure to tie off the tubes that carry sperm (vasectomy). After the procedure, the man can still ejaculate fluid (semen). Natural planning methods Natural family planning In this method, a couple does not have sex on days when the woman could become pregnant. Calendar method This means keeping track of the length of each   menstrual cycle, identifying the days when pregnancy can happen, and not having sex on those days. Ovulation method In this method, a couple avoids sex during ovulation. Symptothermal method This method involves not having sex during ovulation. The woman typically checks for ovulation by watching changes in her temperature and in the consistency of cervical mucus. Post-ovulation method In this method, a couple waits to have sex until after ovulation. Summary  Contraception, also called birth control, means methods or devices that prevent pregnancy.  Hormonal methods of contraception include implants, injections, pills, patches, vaginal rings, and emergency contraceptives.  Barrier methods of contraception can include female condoms, female condoms, diaphragms, cervical caps, sponges, and spermicides.  There are two types of IUDs (intrauterine devices). An IUD can be put in a woman's uterus to prevent pregnancy for 3-5 years.  Permanent sterilization can be done through a procedure for males, females, or both.  Natural family planning methods involve not having sex on days when the woman could become pregnant. This information is not intended to replace advice given to you by your health care provider. Make sure you discuss any questions you have with your health care provider. Document Released: 11/18/2005 Document Revised: 12/21/2016 Document Reviewed: 12/21/2016 [employer] Interactive Patient Education  2018 [employer]  

## 2018-04-03 ENCOUNTER — Other Ambulatory Visit (HOSPITAL_COMMUNITY): Payer: Self-pay | Admitting: *Deleted

## 2018-04-03 ENCOUNTER — Other Ambulatory Visit (HOSPITAL_COMMUNITY): Payer: Self-pay | Admitting: Obstetrics and Gynecology

## 2018-04-03 ENCOUNTER — Ambulatory Visit (HOSPITAL_COMMUNITY)
Admission: RE | Admit: 2018-04-03 | Discharge: 2018-04-03 | Disposition: A | Discharge status: Home / Self Care | Payer: Managed Care, Other (non HMO) | Source: Ambulatory Visit | Attending: Obstetrics & Gynecology | Admitting: Obstetrics & Gynecology

## 2018-04-03 ENCOUNTER — Encounter (HOSPITAL_COMMUNITY): Payer: Self-pay

## 2018-04-03 DIAGNOSIS — Z3A23 23 weeks gestation of pregnancy: Secondary | ICD-10-CM | POA: Diagnosis not present

## 2018-04-03 DIAGNOSIS — O30049 Twin pregnancy, dichorionic/diamniotic, unspecified trimester: Secondary | ICD-10-CM

## 2018-04-03 DIAGNOSIS — O30042 Twin pregnancy, dichorionic/diamniotic, second trimester: Secondary | ICD-10-CM | POA: Diagnosis present

## 2018-04-03 DIAGNOSIS — Z362 Encounter for other antenatal screening follow-up: Secondary | ICD-10-CM

## 2018-04-03 NOTE — Addendum Note (Signed)
Encounter addended by: Emeline Darling on: 04/03/2018 12:33 PM  Actions taken: Imaging Exam ended

## 2018-04-22 ENCOUNTER — Ambulatory Visit (INDEPENDENT_AMBULATORY_CARE_PROVIDER_SITE_OTHER): Payer: Managed Care, Other (non HMO) | Admitting: Obstetrics & Gynecology

## 2018-04-22 VITALS — BP 117/78 | HR 100 | Wt 164.0 lb

## 2018-04-22 DIAGNOSIS — O26899 Other specified pregnancy related conditions, unspecified trimester: Secondary | ICD-10-CM

## 2018-04-22 DIAGNOSIS — Z6791 Unspecified blood type, Rh negative: Secondary | ICD-10-CM

## 2018-04-22 DIAGNOSIS — O26892 Other specified pregnancy related conditions, second trimester: Secondary | ICD-10-CM

## 2018-04-22 DIAGNOSIS — O0990 Supervision of high risk pregnancy, unspecified, unspecified trimester: Secondary | ICD-10-CM

## 2018-04-22 DIAGNOSIS — O0992 Supervision of high risk pregnancy, unspecified, second trimester: Secondary | ICD-10-CM

## 2018-04-22 DIAGNOSIS — O30042 Twin pregnancy, dichorionic/diamniotic, second trimester: Secondary | ICD-10-CM

## 2018-04-22 NOTE — Patient Instructions (Signed)

## 2018-04-22 NOTE — Progress Notes (Signed)
PRENATAL VISIT NOTE  Subjective:  Tara [name] is a 32 y.o. G5P4004 at [redacted]w[redacted]d being seen today for ongoing prenatal care.  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  Patient reports no complaints.  Contractions: Not present.  .  Movement: Present. Denies leaking of fluid.   The following portions of the patient's history were reviewed and updated as appropriate: allergies, current medications, past family history, past medical history, past social history, past surgical history and problem list. Problem list updated.  Objective:   Vitals:   04/22/18 1044  BP: 117/78  Pulse: 100  Weight: 164 lb (74.4 kg)    Fetal Status: Fetal Heart Rate (bpm): 145/150   Movement: Present     General:  Alert, oriented and cooperative. Patient is in no acute distress.  Skin: Skin is warm and dry. No rash noted.   Cardiovascular: Normal heart rate noted  Respiratory: Normal respiratory effort, no problems with respiration noted  Abdomen: Soft, gravid, appropriate for gestational age.  Pain/Pressure: Present     Pelvic: Cervical exam deferred        Extremities: Normal range of motion.  Edema: None  Mental Status: Normal mood and affect. Normal behavior. Normal judgment and thought content.   [place] Mfm Ob Follow Up  Result Date: 04/03/2018 ----------------------------------------------------------------------  OBSTETRICS REPORT                      (Signed Final 04/03/2018 12:22 pm) ---------------------------------------------------------------------- Patient Info  ID #:       [id number]                          D.O.B.:  [birth year] (31 yrs)  Name:       [facility]               Visit Date: 04/03/2018 11:59 am  ---------------------------------------------------------------------- Performed By  Performed By:     Vivien Rota        Ref. Address:     [address]                    [address]                                                             [address]  Attending:        Charlsie Merles MD         Location:         [facility]  Referred By:      [facility] for                    [facility]                    Healthcare ---------------------------------------------------------------------- Orders   #  Description  Code   1  [place] MFM OB FOLLOW UP                         [id number]   2  [place] MFM OB FOLLOW UP ADDL GEST               [id number]  ----------------------------------------------------------------------   #  Ordered By               Order #        Accession #    Episode #   1  MARK NEWMAN              [id number]      [id number]     [id number]   2  MARK NEWMAN              [id number]      [id number]     [id number]  ---------------------------------------------------------------------- Indications   [redacted] weeks gestation of pregnancy                Z3A.23   Twin pregnancy, di/di, second trimester        O30.042   Encounter for other antenatal screening        Z36.2   follow-up  ---------------------------------------------------------------------- OB History  Gravidity:    5         Term:   4  Living:       4 ---------------------------------------------------------------------- Fetal Evaluation (Fetus A)  Num Of Fetuses:     2  Fetal Heart         145  Rate(bpm):  Cardiac Activity:   Observed  Fetal Lie:          Maternal left side  Presentation:       Transverse, head to maternal right  Placenta:           Anterior, above cervical os  P. Cord Insertion:  Previously Visualized  Amniotic Fluid  AFI FV:      Subjectively within normal limits                              Largest Pocket(cm)                              5.60  ---------------------------------------------------------------------- Biometry (Fetus A)  BPD:      54.4  mm     G. Age:  22w 4d         20  %    CI:        72.51   %    70 - 86                                                          FL/HC:      18.5   %    19.2 - 20.8  HC:      203.2  mm     G. Age:  22w 3d         10  %    HC/AC:      1.11        1.05 - 1.21  AC:      182.5  mm     G. Age:  23w 0d         35  %    FL/BPD:     68.9   %    71 - 87  FL:       37.5  mm     G. Age:  22w 0d          8  %    FL/AC:      20.5   %    20 - 24  HUM:      34.3  mm     G. Age:  21w 5d          9  %  Est. FW:     516  gm      1 lb 2 oz     36  %     FW Discordancy         5  % ---------------------------------------------------------------------- Gestational Age (Fetus A)  LMP:           23w 2d        Date:  10/22/17                 EDD:   07/29/18  U/S Today:     22w 4d                                        EDD:   08/03/18  Best:          23w 2d     Det. By:  LMP  (10/22/17)          EDD:   07/29/18 ---------------------------------------------------------------------- Anatomy (Fetus A)  Cranium:               Appears normal         Aortic Arch:            Previously seen  Cavum:                 Previously seen        Ductal Arch:            Previously seen  Ventricles:            Appears normal         Diaphragm:              Previously seen  Choroid Plexus:        Previously seen        Stomach:                Appears normal, left                                                                        sided  Cerebellum:            Previously seen        Abdomen:                Appears normal  Posterior Fossa:       Previously seen        Abdominal Wall:  Previously seen  Nuchal Fold:           Not applicable (>20    Cord Vessels:           Previously seen                         wks GA)  Face:                  Orbits previously      Kidneys:                Appear normal                         seen  Thoracic:               Appears normal         Bladder:                Appears normal  Heart:                 Previously seen        Spine:                  Previously seen  RVOT:                  Previously seen        Upper Extremities:      Previously seen  LVOT:                  Appears normal         Lower Extremities:      Previously seen  Other:  Female gender. Heels previously visualized. ---------------------------------------------------------------------- Fetal Evaluation (Fetus B)  Num Of Fetuses:     2  Fetal Heart         145  Rate(bpm):  Cardiac Activity:   Observed  Fetal Lie:          Maternal right side  Presentation:       Transverse, head to maternal left  Placenta:           Posterior, above cervical os  P. Cord Insertion:  Previously Visualized  Amniotic Fluid  AFI FV:      Subjectively within normal limits                              Largest Pocket(cm)                              6.65 ---------------------------------------------------------------------- Biometry (Fetus B)  BPD:      55.2  mm     G. Age:  22w 6d         28  %    CI:        73.42   %    70 - 86                                                          FL/HC:      18.8   %    19.2 - 20.8  HC:  204.7  mm     G. Age:  22w 4d         13  %    HC/AC:      1.10        1.05 - 1.21  AC:      186.6  mm     G. Age:  23w 3d         47  %    FL/BPD:     69.6   %    71 - 87  FL:       38.4  mm     G. Age:  22w 2d         13  %    FL/AC:      20.6   %    20 - 24  HUM:      35.2  mm     G. Age:  22w 1d         18  %  Est. FW:     545  gm      1 lb 3 oz     45  %     FW Discordancy      0 \ 5 % ---------------------------------------------------------------------- Gestational Age (Fetus B)  LMP:           23w 2d        Date:  10/22/17                 EDD:   07/29/18  U/S Today:     22w 6d                                        EDD:   08/01/18  Best:          23w 2d     Det. By:  LMP  (10/22/17)          EDD:   07/29/18  ---------------------------------------------------------------------- Anatomy (Fetus B)  Cranium:               Appears normal         Aortic Arch:            Previously seen  Cavum:                 Previously seen        Ductal Arch:            Previously seen  Ventricles:            Previously seen        Diaphragm:              Previously seen  Choroid Plexus:        Previously seen        Stomach:                Appears normal, left                                                                        sided  Cerebellum:            Previously seen  Abdomen:                Appears normal  Posterior Fossa:       Previously seen        Abdominal Wall:         Previously seen  Nuchal Fold:           Not applicable (>20    Cord Vessels:           Previously seen                         wks GA)  Face:                  Orbits previously      Kidneys:                Appear normal                         seen  Lips:                  Previously seen        Bladder:                Appears normal  Thoracic:              Appears normal         Spine:                  Previously seen  Heart:                 Previously seen        Upper Extremities:      Previously seen  RVOT:                  Previously seen        Lower Extremities:      Previously seen  LVOT:                  Previously seen  Other:  Female gender. Heels and 5th digit previously visualized. ---------------------------------------------------------------------- Cervix Uterus Adnexa  Cervix  Length:           4.28  cm.  Normal appearance by transabdominal scan.  Uterus  No abnormality visualized.  Left Ovary  Within normal limits.  Right Ovary  Not visualized.  Adnexa:       No abnormality visualized. No adnexal mass                visualized. ---------------------------------------------------------------------- Impression  Dichorionic/diamniotic twin pregnancy at 23+2 weeks  Twin A:  Presentation is transverse  Interval review of fetal anatomy was  normal  Normal amniotic fluid volume  Appropriate interval growth with EFW at the 36th percentile  Twin B:  Presentation is transverse  Interval review of fetal anatomy was normal  Normal amniotic fluid volume  Appropriate interval growth with EFW at the 45th percentile  Dicordance is 5% ---------------------------------------------------------------------- Recommendations  Repeat scan in 4 weeks ----------------------------------------------------------------------                 Charlsie Merles, MD Electronically Signed Final Report   04/03/2018 12:22 pm ----------------------------------------------------------------------  [place] Mfm Ob Follow Up Addl Gest  Result Date: 04/03/2018 ----------------------------------------------------------------------  OBSTETRICS REPORT                      (Signed Final 04/03/2018  12:22 pm) ---------------------------------------------------------------------- Patient Info  ID #:       [id number]                          D.O.B.:  10/16/1986 (31 yrs)  Name:       [facility]               Visit Date: 04/03/2018 11:59 am ---------------------------------------------------------------------- Performed By  Performed By:     Vivien Rota        Ref. Address:     [address]                    [address]                                                             [address]  Attending:        Charlsie Merles MD         Location:         [facility]  Referred By:      [facility] for                    [facility]                    Healthcare ---------------------------------------------------------------------- Orders   #  Description                                 Code   1  [place] MFM OB FOLLOW UP                         40981.19   2  [place] MFM OB FOLLOW UP ADDL GEST               14782.95  ----------------------------------------------------------------------   #  Ordered By               Order #        Accession #    Episode #   1  MARK Ezzard Standing              [id number]       [id number]     [id number]   2  MARK NEWMAN              [id number]      [id number]     [id number]  ---------------------------------------------------------------------- Indications   [redacted] weeks gestation of pregnancy                Z3A.23   Twin pregnancy, di/di, second trimester        O30.042   Encounter for other antenatal screening        Z36.2   follow-up  ---------------------------------------------------------------------- OB History  Gravidity:    5         Term:   4  Living:       4 ---------------------------------------------------------------------- Fetal Evaluation (Fetus A)  Num Of Fetuses:     2  Fetal Heart  145  Rate(bpm):  Cardiac Activity:   Observed  Fetal Lie:          Maternal left side  Presentation:       Transverse, head to maternal right  Placenta:           Anterior, above cervical os  P. Cord Insertion:  Previously Visualized  Amniotic Fluid  AFI FV:      Subjectively within normal limits                              Largest Pocket(cm)                              5.60 ---------------------------------------------------------------------- Biometry (Fetus A)  BPD:      54.4  mm     G. Age:  22w 4d         20  %    CI:        72.51   %    70 - 86                                                          FL/HC:      18.5   %    19.2 - 20.8  HC:      203.2  mm     G. Age:  22w 3d         10  %    HC/AC:      1.11        1.05 - 1.21  AC:      182.5  mm     G. Age:  23w 0d         35  %    FL/BPD:     68.9   %    71 - 87  FL:       37.5  mm     G. Age:  22w 0d          8  %    FL/AC:      20.5   %    20 - 24  HUM:      34.3  mm     G. Age:  21w 5d          9  %  Est. FW:     516  gm      1 lb 2 oz     36  %     FW Discordancy         5  % ---------------------------------------------------------------------- Gestational Age (Fetus A)  LMP:           23w 2d        Date:  10/22/17                 EDD:   07/29/18  U/S Today:     22w 4d                                        EDD:   08/03/18   Best:  23w 2d     Det. By:  LMP  (10/22/17)          EDD:   07/29/18 ---------------------------------------------------------------------- Anatomy (Fetus A)  Cranium:               Appears normal         Aortic Arch:            Previously seen  Cavum:                 Previously seen        Ductal Arch:            Previously seen  Ventricles:            Appears normal         Diaphragm:              Previously seen  Choroid Plexus:        Previously seen        Stomach:                Appears normal, left                                                                        sided  Cerebellum:            Previously seen        Abdomen:                Appears normal  Posterior Fossa:       Previously seen        Abdominal Wall:         Previously seen  Nuchal Fold:           Not applicable (>20    Cord Vessels:           Previously seen                         wks GA)  Face:                  Orbits previously      Kidneys:                Appear normal                         seen  Thoracic:              Appears normal         Bladder:                Appears normal  Heart:                 Previously seen        Spine:                  Previously seen  RVOT:                  Previously seen        Upper Extremities:      Previously seen  LVOT:  Appears normal         Lower Extremities:      Previously seen  Other:  Female gender. Heels previously visualized. ---------------------------------------------------------------------- Fetal Evaluation (Fetus B)  Num Of Fetuses:     2  Fetal Heart         145  Rate(bpm):  Cardiac Activity:   Observed  Fetal Lie:          Maternal right side  Presentation:       Transverse, head to maternal left  Placenta:           Posterior, above cervical os  P. Cord Insertion:  Previously Visualized  Amniotic Fluid  AFI FV:      Subjectively within normal limits                              Largest Pocket(cm)                              6.65  ---------------------------------------------------------------------- Biometry (Fetus B)  BPD:      55.2  mm     G. Age:  22w 6d         28  %    CI:        73.42   %    70 - 86                                                          FL/HC:      18.8   %    19.2 - 20.8  HC:      204.7  mm     G. Age:  22w 4d         13  %    HC/AC:      1.10        1.05 - 1.21  AC:      186.6  mm     G. Age:  23w 3d         47  %    FL/BPD:     69.6   %    71 - 87  FL:       38.4  mm     G. Age:  22w 2d         13  %    FL/AC:      20.6   %    20 - 24  HUM:      35.2  mm     G. Age:  22w 1d         18  %  Est. FW:     545  gm      1 lb 3 oz     45  %     FW Discordancy      0 \ 5 % ---------------------------------------------------------------------- Gestational Age (Fetus B)  LMP:           23w 2d        Date:  10/22/17                 EDD:   07/29/18  U/S Today:     22w 6d  EDD:   08/01/18  Best:          23w 2d     Det. By:  LMP  (10/22/17)          EDD:   07/29/18 ---------------------------------------------------------------------- Anatomy (Fetus B)  Cranium:               Appears normal         Aortic Arch:            Previously seen  Cavum:                 Previously seen        Ductal Arch:            Previously seen  Ventricles:            Previously seen        Diaphragm:              Previously seen  Choroid Plexus:        Previously seen        Stomach:                Appears normal, left                                                                        sided  Cerebellum:            Previously seen        Abdomen:                Appears normal  Posterior Fossa:       Previously seen        Abdominal Wall:         Previously seen  Nuchal Fold:           Not applicable (>20    Cord Vessels:           Previously seen                         wks GA)  Face:                  Orbits previously      Kidneys:                Appear normal                         seen  Lips:                   Previously seen        Bladder:                Appears normal  Thoracic:              Appears normal         Spine:                  Previously seen  Heart:                 Previously seen        Upper Extremities:      Previously  seen  RVOT:                  Previously seen        Lower Extremities:      Previously seen  LVOT:                  Previously seen  Other:  Female gender. Heels and 5th digit previously visualized. ---------------------------------------------------------------------- Cervix Uterus Adnexa  Cervix  Length:           4.28  cm.  Normal appearance by transabdominal scan.  Uterus  No abnormality visualized.  Left Ovary  Within normal limits.  Right Ovary  Not visualized.  Adnexa:       No abnormality visualized. No adnexal mass                visualized. ---------------------------------------------------------------------- Impression  Dichorionic/diamniotic twin pregnancy at 23+2 weeks  Twin A:  Presentation is transverse  Interval review of fetal anatomy was normal  Normal amniotic fluid volume  Appropriate interval growth with EFW at the 36th percentile  Twin B:  Presentation is transverse  Interval review of fetal anatomy was normal  Normal amniotic fluid volume  Appropriate interval growth with EFW at the 45th percentile  Dicordance is 5% ---------------------------------------------------------------------- Recommendations  Repeat scan in 4 weeks ----------------------------------------------------------------------                 Charlsie Merles, MD Electronically Signed Final Report   04/03/2018 12:22 pm ----------------------------------------------------------------------   Assessment and Plan:  Pregnancy: [id number] at [redacted]w[redacted]d  1. Dichorionic diamniotic twin pregnancy in second trimester Next scan scheduled on 05/01/18.  2. Rh negative state in antepartum period Rhogam next visit  3. Supervision of high risk pregnancy, antepartum Third trimester labs next visit Preterm  labor symptoms and general obstetric precautions including but not limited to vaginal bleeding, contractions, leaking of fluid and fetal movement were reviewed in detail with the patient. Please refer to After Visit Summary for other counseling recommendations.  Return in about 2 weeks (around 05/06/2018) for 2 hr GTT, 3rd trimester labs, TDap, Rhogam, OB Visit.  Future Appointments  Date Time Provider Department Center  05/01/2018  1:30 PM [facility] [place] 1 [facility]    Jaynie Collins, MD

## 2018-04-30 ENCOUNTER — Other Ambulatory Visit (HOSPITAL_COMMUNITY): Payer: Self-pay | Admitting: *Deleted

## 2018-05-01 ENCOUNTER — Other Ambulatory Visit (HOSPITAL_COMMUNITY): Payer: Self-pay | Admitting: *Deleted

## 2018-05-01 ENCOUNTER — Encounter (HOSPITAL_COMMUNITY): Payer: Self-pay

## 2018-05-01 ENCOUNTER — Ambulatory Visit (HOSPITAL_COMMUNITY)
Admission: RE | Admit: 2018-05-01 | Discharge: 2018-05-01 | Disposition: A | Discharge status: Home / Self Care | Payer: Managed Care, Other (non HMO) | Source: Ambulatory Visit | Attending: Obstetrics & Gynecology | Admitting: Obstetrics & Gynecology

## 2018-05-01 ENCOUNTER — Other Ambulatory Visit (HOSPITAL_COMMUNITY): Payer: Self-pay | Admitting: Obstetrics and Gynecology

## 2018-05-01 DIAGNOSIS — O321XX0 Maternal care for breech presentation, not applicable or unspecified: Secondary | ICD-10-CM | POA: Diagnosis not present

## 2018-05-01 DIAGNOSIS — O30042 Twin pregnancy, dichorionic/diamniotic, second trimester: Secondary | ICD-10-CM | POA: Diagnosis not present

## 2018-05-01 DIAGNOSIS — Z302 Encounter for sterilization: Secondary | ICD-10-CM | POA: Insufficient documentation

## 2018-05-01 DIAGNOSIS — Z3A27 27 weeks gestation of pregnancy: Secondary | ICD-10-CM | POA: Diagnosis not present

## 2018-05-01 DIAGNOSIS — Z362 Encounter for other antenatal screening follow-up: Secondary | ICD-10-CM

## 2018-05-01 DIAGNOSIS — O30049 Twin pregnancy, dichorionic/diamniotic, unspecified trimester: Secondary | ICD-10-CM

## 2018-05-01 DIAGNOSIS — O0990 Supervision of high risk pregnancy, unspecified, unspecified trimester: Secondary | ICD-10-CM

## 2018-05-01 DIAGNOSIS — Z6791 Unspecified blood type, Rh negative: Secondary | ICD-10-CM

## 2018-05-01 DIAGNOSIS — O26899 Other specified pregnancy related conditions, unspecified trimester: Secondary | ICD-10-CM

## 2018-05-06 ENCOUNTER — Encounter: Payer: Managed Care, Other (non HMO) | Admitting: Obstetrics and Gynecology

## 2018-05-06 ENCOUNTER — Encounter: Payer: Self-pay | Admitting: Obstetrics and Gynecology

## 2018-05-06 ENCOUNTER — Ambulatory Visit (INDEPENDENT_AMBULATORY_CARE_PROVIDER_SITE_OTHER): Payer: Managed Care, Other (non HMO) | Admitting: Obstetrics and Gynecology

## 2018-05-06 VITALS — BP 116/77 | HR 125 | Wt 166.0 lb

## 2018-05-06 DIAGNOSIS — Z6791 Unspecified blood type, Rh negative: Secondary | ICD-10-CM

## 2018-05-06 DIAGNOSIS — O26899 Other specified pregnancy related conditions, unspecified trimester: Secondary | ICD-10-CM

## 2018-05-06 DIAGNOSIS — O0990 Supervision of high risk pregnancy, unspecified, unspecified trimester: Secondary | ICD-10-CM

## 2018-05-06 DIAGNOSIS — O30042 Twin pregnancy, dichorionic/diamniotic, second trimester: Secondary | ICD-10-CM

## 2018-05-06 NOTE — Progress Notes (Signed)
   PRENATAL VISIT NOTE  Subjective:  Tara [name] is a 32 y.o. G5P4004 at 4242w0d being seen today for ongoing prenatal care.  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  Patient reports no complaints.  Contractions: Not present. Vag. Bleeding: None.  Movement: Present. Denies leaking of fluid.   The following portions of the patient's history were reviewed and updated as appropriate: allergies, current medications, past family history, past medical history, past social history, past surgical history and problem list. Problem list updated.  Objective:   Vitals:   05/06/18 1031  BP: 116/77  Pulse: (!) 125  Weight: 166 lb (75.3 kg)    Fetal Status: Fetal Heart Rate (bpm): 150/152   Movement: Present     General:  Alert, oriented and cooperative. Patient is in no acute distress.  Skin: Skin is warm and dry. No rash noted.   Cardiovascular: Normal heart rate noted  Respiratory: Normal respiratory effort, no problems with respiration noted  Abdomen: Soft, gravid, appropriate for gestational age.  Pain/Pressure: Absent     Pelvic: Cervical exam deferred        Extremities: Normal range of motion.  Edema: None  Mental Status: Normal mood and affect. Normal behavior. Normal judgment and thought content.   Assessment and Plan:  Pregnancy: G5P4004 at 2242w0d  1. Supervision of high risk pregnancy, antepartum Patient is doing well Patient will return for third trimester labs because she wants to eat Patient planning BTL- consent signed - Glucose Tolerance, 2 Hours w/1 Hour - CBC - RPR - HIV antibody  2. Dichorionic diamniotic  twin pregnancy in second trimester Normal growth on 5/31 Follow up growth scheduled end of June  3. Rh negative state in antepartum period Rhogam on Monday with third trimester labs  Preterm labor symptoms and general obstetric precautions including but not limited to vaginal bleeding, contractions, leaking of fluid and fetal movement were reviewed in detail with the patient. Please refer to After Visit Summary for other counseling recommendations.  Return in about 2 weeks (around 05/20/2018) for ROB.  Future Appointments  Date Time Provider Department Center  05/11/2018  8:30 AM [facility]  05/29/2018  1:15 PM [facility] US 4 WH-[facility]  06/26/2018  1:30 PM [facility] US 1 [facility]    Catalina AntiguaPeggy Devarious Pavek, MD

## 2018-05-11 ENCOUNTER — Other Ambulatory Visit: Payer: Managed Care, Other (non HMO)

## 2018-05-18 ENCOUNTER — Other Ambulatory Visit: Payer: Managed Care, Other (non HMO)

## 2018-05-20 ENCOUNTER — Encounter: Payer: Self-pay | Admitting: *Deleted

## 2018-05-20 ENCOUNTER — Ambulatory Visit (INDEPENDENT_AMBULATORY_CARE_PROVIDER_SITE_OTHER): Payer: Managed Care, Other (non HMO) | Admitting: Obstetrics and Gynecology

## 2018-05-20 ENCOUNTER — Encounter: Payer: Self-pay | Admitting: Obstetrics and Gynecology

## 2018-05-20 VITALS — BP 114/75 | HR 111 | Wt 168.0 lb

## 2018-05-20 DIAGNOSIS — O0993 Supervision of high risk pregnancy, unspecified, third trimester: Secondary | ICD-10-CM

## 2018-05-20 DIAGNOSIS — R768 Other specified abnormal immunological findings in serum: Secondary | ICD-10-CM

## 2018-05-20 DIAGNOSIS — O26893 Other specified pregnancy related conditions, third trimester: Secondary | ICD-10-CM

## 2018-05-20 DIAGNOSIS — Z6791 Unspecified blood type, Rh negative: Secondary | ICD-10-CM

## 2018-05-20 DIAGNOSIS — O30043 Twin pregnancy, dichorionic/diamniotic, third trimester: Secondary | ICD-10-CM

## 2018-05-20 DIAGNOSIS — O26899 Other specified pregnancy related conditions, unspecified trimester: Secondary | ICD-10-CM

## 2018-05-20 DIAGNOSIS — O0990 Supervision of high risk pregnancy, unspecified, unspecified trimester: Secondary | ICD-10-CM

## 2018-05-20 MED ORDER — POLYETHYLENE GLYCOL 3350 17 G PO PACK: 17.0000 g | PACK | Freq: Every day | ORAL | 1 refills | Status: DC

## 2018-05-20 MED ORDER — RANITIDINE HCL 150 MG PO CAPS: 150.0000 mg | ORAL_CAPSULE | Freq: Two times a day (BID) | ORAL | 2 refills | Status: DC

## 2018-05-20 NOTE — Progress Notes (Signed)
Prenatal Visit Note Date: 05/20/2018 Clinic: [facility]  Subjective:  Tara [name] is a 32 y.o. G5P4004 at 8372w0d being seen today for ongoing prenatal care.  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  Patient reports no complaints.   Contractions: Not present. Vag. Bleeding: None.  Movement: Present. Denies leaking of fluid.   The following portions of the patient's history were reviewed and updated as appropriate: allergies, current medications, past family history, past medical history, past social history, past surgical history and problem list. Problem list updated.  Objective:   Vitals:   05/20/18 1101  BP: 114/75  Pulse: (!) 111  Weight: 168 lb (76.2 kg)    Fetal Status: Fetal Heart Rate (bpm): 143/152   Movement: Present     General:  Alert, oriented and cooperative. Patient is in no acute distress.  Skin: Skin is warm and dry. No rash noted.   Cardiovascular: Normal heart rate noted  Respiratory: Normal respiratory effort, no problems with respiration noted  Abdomen: Soft, gravid, appropriate for gestational age. Pain/Pressure: Absent     Pelvic:  Cervical exam deferred        Extremities: Normal range of motion.  Edema: Mild pitting, slight indentation  Mental Status: Normal mood and affect. Normal behavior. Normal judgment and thought content.   Urinalysis:      Assessment and Plan:  Pregnancy: G5P4004 at 3172w0d  1. Supervision of high risk pregnancy, antepartum Routine care. btl papers signed last visit. Pt coming back on Monday to re-do GTT  2. Rh negative state in antepartum period It looks like she's never  received it this pregnancy. Pt sleeping (works third shift) so will have staff call her first thing in am. If pt can also do 28wk labs then with an antibody screen, that's fine too. She can do a 1hr GTT if she desires.   3. Dichorionic diamniotic twin pregnancy in third trimester Serial u/s already ordered. Desires VD  4. 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 Please refer to After Visit Summary for other counseling recommendations.  Return in about 2 weeks (around 06/03/2018) for hrob.   [place] BingPickens, Kristof Nadeem, MD

## 2018-05-25 ENCOUNTER — Other Ambulatory Visit (INDEPENDENT_AMBULATORY_CARE_PROVIDER_SITE_OTHER): Payer: Managed Care, Other (non HMO)

## 2018-05-25 DIAGNOSIS — Z23 Encounter for immunization: Secondary | ICD-10-CM

## 2018-05-25 DIAGNOSIS — O360930 Maternal care for other rhesus isoimmunization, third trimester, not applicable or unspecified: Secondary | ICD-10-CM | POA: Diagnosis not present

## 2018-05-25 DIAGNOSIS — O0990 Supervision of high risk pregnancy, unspecified, unspecified trimester: Secondary | ICD-10-CM

## 2018-05-25 MED ORDER — RHO D IMMUNE GLOBULIN 1500 UNIT/2ML IJ SOSY
300.0000 ug | PREFILLED_SYRINGE | Freq: Once | INTRAMUSCULAR | Status: AC
  Administered 2018-05-25: 300 ug via INTRAMUSCULAR

## 2018-05-25 NOTE — Progress Notes (Signed)
Patient received Rhogam injection without complication.  I have reviewed the chart and agree with nursing staff's documentation of this patient's encounter.  Jaynie CollinsUgonna Felisia Balcom, MD 05/25/2018 1:45 PM

## 2018-05-26 LAB — GLUCOSE TOLERANCE, 2 HOURS W/ 1HR
GLUCOSE, 1 HOUR: 177 mg/dL (ref 65–179)
GLUCOSE, 2 HOUR: 139 mg/dL (ref 65–152)
Glucose, Fasting: 67 mg/dL (ref 65–91)

## 2018-05-26 LAB — RPR: RPR Ser Ql: NONREACTIVE

## 2018-05-26 LAB — CBC
HEMATOCRIT: 31 % — AB (ref 34.0–46.6)
HEMOGLOBIN: 10.3 g/dL — AB (ref 11.1–15.9)
MCH: 33.3 pg — ABNORMAL HIGH (ref 26.6–33.0)
MCHC: 33.2 g/dL (ref 31.5–35.7)
MCV: 100 fL — AB (ref 79–97)
Platelets: 233 10*3/uL (ref 150–450)
RBC: 3.09 x10E6/uL — ABNORMAL LOW (ref 3.77–5.28)
RDW: 13.6 % (ref 12.3–15.4)
WBC: 9.5 10*3/uL (ref 3.4–10.8)

## 2018-05-26 LAB — ANTIBODY SCREEN: Antibody Screen: NEGATIVE

## 2018-05-26 LAB — HIV ANTIBODY (ROUTINE TESTING W REFLEX): HIV Screen 4th Generation wRfx: NONREACTIVE

## 2018-05-29 ENCOUNTER — Other Ambulatory Visit (HOSPITAL_COMMUNITY): Payer: Self-pay | Admitting: Obstetrics and Gynecology

## 2018-05-29 ENCOUNTER — Encounter (HOSPITAL_COMMUNITY): Payer: Self-pay

## 2018-05-29 ENCOUNTER — Ambulatory Visit (HOSPITAL_COMMUNITY)
Admission: RE | Admit: 2018-05-29 | Discharge: 2018-05-29 | Disposition: A | Discharge status: Home / Self Care | Payer: Managed Care, Other (non HMO) | Source: Ambulatory Visit | Attending: Obstetrics & Gynecology | Admitting: Obstetrics & Gynecology

## 2018-05-29 DIAGNOSIS — Z3A31 31 weeks gestation of pregnancy: Secondary | ICD-10-CM

## 2018-05-29 DIAGNOSIS — Z362 Encounter for other antenatal screening follow-up: Secondary | ICD-10-CM

## 2018-05-29 DIAGNOSIS — Z6791 Unspecified blood type, Rh negative: Secondary | ICD-10-CM

## 2018-05-29 DIAGNOSIS — O30049 Twin pregnancy, dichorionic/diamniotic, unspecified trimester: Secondary | ICD-10-CM

## 2018-05-29 DIAGNOSIS — O30043 Twin pregnancy, dichorionic/diamniotic, third trimester: Secondary | ICD-10-CM | POA: Insufficient documentation

## 2018-05-29 DIAGNOSIS — O26899 Other specified pregnancy related conditions, unspecified trimester: Secondary | ICD-10-CM

## 2018-05-29 DIAGNOSIS — O0990 Supervision of high risk pregnancy, unspecified, unspecified trimester: Secondary | ICD-10-CM

## 2018-06-02 ENCOUNTER — Ambulatory Visit (INDEPENDENT_AMBULATORY_CARE_PROVIDER_SITE_OTHER): Payer: Managed Care, Other (non HMO) | Admitting: Obstetrics & Gynecology

## 2018-06-02 ENCOUNTER — Encounter: Payer: Self-pay | Admitting: Obstetrics & Gynecology

## 2018-06-02 VITALS — BP 106/70 | HR 72 | Wt 169.6 lb

## 2018-06-02 DIAGNOSIS — O0990 Supervision of high risk pregnancy, unspecified, unspecified trimester: Secondary | ICD-10-CM

## 2018-06-02 DIAGNOSIS — G4701 Insomnia due to medical condition: Secondary | ICD-10-CM

## 2018-06-02 DIAGNOSIS — O30043 Twin pregnancy, dichorionic/diamniotic, third trimester: Secondary | ICD-10-CM

## 2018-06-02 MED ORDER — TRIAMCINOLONE ACETONIDE 0.5 % EX CREA: 1.0000 "application " | TOPICAL_CREAM | Freq: Three times a day (TID) | CUTANEOUS | 0 refills | Status: DC

## 2018-06-02 MED ORDER — ZOLPIDEM TARTRATE 5 MG PO TABS: 5.0000 mg | ORAL_TABLET | Freq: Every evening | ORAL | 1 refills | Status: DC | PRN

## 2018-06-02 NOTE — Progress Notes (Signed)
PRENATAL VISIT NOTE  Subjective:  Tara [name] is a 32 y.o. G5P4004 at [redacted]w[redacted]d being seen today for ongoing prenatal care.  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  Patient reports very itchy stretch marks; has tried several topical agents to help but to no avail. Itching only around stretch marks. Also has insomnia; Benadryl not working.  Contractions: Not present.  .  Movement: Present. Denies leaking of fluid.   The following portions of the patient's history were reviewed and updated as appropriate: allergies, current medications, past family history, past medical history, past social history, past surgical history and problem list. Problem list updated.  Objective:   Vitals:   06/02/18 1333  BP: 106/70  Pulse: 72  Weight: 169 lb 9.6 oz (76.9 kg)    Fetal Status: Fetal Heart Rate (bpm): 138/148   Movement: Present     General:  Alert, oriented and cooperative. Patient is in no acute distress.  Skin: Skin is warm and dry. No rash noted.   Cardiovascular: Normal heart rate noted  Respiratory: Normal respiratory effort, no problems with respiration noted  Abdomen: Soft, gravid, appropriate for gestational age.  Pain/Pressure: Absent     Pelvic: Cervical exam deferred        Extremities: Normal range of motion.  Edema: Mild pitting, slight indentation  Mental Status: Normal mood and affect. Normal behavior. Normal judgment and thought content.   [place] Mfm Ob Follow Up  Result Date: 05/29/2018 ----------------------------------------------------------------------  OBSTETRICS REPORT                      (Signed Final 05/29/2018 02:27 pm)  ---------------------------------------------------------------------- Patient Info  ID #:       [id number]                          D.O.B.:  1986-08-01 (31 yrs)  Name:       Tara [name]               Visit Date: 05/29/2018 01:16 pm ---------------------------------------------------------------------- Performed By  Performed By:     Eden Lathe BS      Ref. Address:     [address]                    [address]                                                             [address]  Attending:        Darlyn Read MD         Location:         [facility]  Referred By:      [facility] for                    [facility]  Healthcare ---------------------------------------------------------------------- Orders   #  Description                                 Code   1  [place] MFM OB FOLLOW UP                         [id number]   2  [place] MFM OB FOLLOW UP ADDL GEST               40981.19  ----------------------------------------------------------------------   #  Ordered By               Order #        Accession #    Episode #   1  JEFFREY Marjo Bicker           [id number]      [id number]     [id number]   2  JEFFREY DENNEY           [id number]      [id number]     [id number]  ---------------------------------------------------------------------- Indications   [redacted] weeks gestation of pregnancy                Z3A.49   Twin pregnancy, di/di, third trimester         O30.043   Encounter for other antenatal screening        Z36.2   follow-up  ---------------------------------------------------------------------- OB History  Gravidity:    5         Term:   4  Living:       4 ---------------------------------------------------------------------- Fetal Evaluation (Fetus A)  Num Of Fetuses:     2  Fetal Heart         147  Rate(bpm):  Cardiac Activity:   Observed  Fetal Lie:          Maternal left side  Presentation:       Cephalic  Placenta:           Anterior, above cervical os  P. Cord Insertion:   Visualized  Amniotic Fluid  AFI FV:      Subjectively within normal limits                              Largest Pocket(cm)                              7.94 ---------------------------------------------------------------------- Biometry (Fetus A)  BPD:        76  mm     G. Age:  30w 4d         18  %    CI:        73.12   %    70 - 86                                                          FL/HC:      20.7   %    19.3 - 21.3  HC:      282.5  mm     G. Age:  56w 0d  10  %    HC/AC:      1.01        0.96 - 1.17  AC:      279.3  mm     G. Age:  32w 0d         68  %    FL/BPD:     77.0   %    71 - 87  FL:       58.5  mm     G. Age:  30w 4d         19  %    FL/AC:      20.9   %    20 - 24  HUM:      48.1  mm     G. Age:  28w 1d        < 5  %  Est. FW:    1743  gm    3 lb 13 oz      56  %     FW Discordancy         5  % ---------------------------------------------------------------------- Gestational Age (Fetus A)  LMP:           31w 2d        Date:  10/22/17                 EDD:   07/29/18  U/S Today:     31w 0d                                        EDD:   07/31/18  Best:          31w 2d     Det. By:  LMP  (10/22/17)          EDD:   07/29/18 ---------------------------------------------------------------------- Anatomy (Fetus A)  Cranium:               Appears normal         Aortic Arch:            Previously seen  Cavum:                 Appears normal         Ductal Arch:            Previously seen  Ventricles:            Appears normal         Diaphragm:              Appears normal  Choroid Plexus:        Previously seen        Stomach:                Appears normal, left                                                                        sided  Cerebellum:            Previously seen        Abdomen:  Appears normal  Posterior Fossa:       Previously seen        Abdominal Wall:         Previously seen  Nuchal Fold:           Not applicable (>20    Cord Vessels:           Previously seen                          wks GA)  Face:                  Profile nl; orbits     Kidneys:                Appear normal                         prev visualized  Lips:                  Appears normal         Bladder:                Appears normal  Thoracic:              Appears normal         Spine:                  Previously seen  Heart:                 Previously seen        Upper Extremities:      Previously seen  RVOT:                  Previously seen        Lower Extremities:      Previously seen  LVOT:                  Appears normal  Other:  Female gender. Heels previously visualized. ---------------------------------------------------------------------- Fetal Evaluation (Fetus B)  Num Of Fetuses:     2  Fetal Heart         143  Rate(bpm):  Cardiac Activity:   Observed  Fetal Lie:          Maternal right side  Presentation:       Cephalic  Placenta:           Posterior, above cervical os  P. Cord Insertion:  Previously Visualized  Membrane Desc:      Dividing Membrane seen - Dichorionic.  Amniotic Fluid  AFI FV:      Subjectively within normal limits                              Largest Pocket(cm)                              7.93 ---------------------------------------------------------------------- Biometry (Fetus B)  BPD:      76.7  mm     G. Age:  30w 5d         24  %    CI:        69.45   %    70 - 86  FL/HC:      20.9   %    19.3 - 21.3  HC:      293.8  mm     G. Age:  32w 3d         45  %    HC/AC:      1.06        0.96 - 1.17  AC:       277   mm     G. Age:  31w 6d         61  %    FL/BPD:     79.9   %    71 - 87  FL:       61.3  mm     G. Age:  31w 6d         51  %    FL/AC:      22.1   %    20 - 24  HUM:      49.6  mm     G. Age:  29w 1d          5  %  Est. FW:    1832  gm      4 lb 1 oz     65  %     FW Discordancy      0 \ 5 % ---------------------------------------------------------------------- Gestational Age (Fetus B)  LMP:           31w 2d         Date:  10/22/17                 EDD:   07/29/18  U/S Today:     31w 5d                                        EDD:   07/26/18  Best:          31w 2d     Det. By:  LMP  (10/22/17)          EDD:   07/29/18 ---------------------------------------------------------------------- Anatomy (Fetus B)  Cranium:               Appears normal         Aortic Arch:            Previously seen  Cavum:                 Previously seen        Ductal Arch:            Previously seen  Ventricles:            Appears normal         Diaphragm:              Appears normal  Choroid Plexus:        Previously seen        Stomach:                Appears normal, left  sided  Cerebellum:            Previously seen        Abdomen:                Appears normal  Posterior Fossa:       Previously seen        Abdominal Wall:         Previously seen  Nuchal Fold:           Not applicable (>20    Cord Vessels:           Previously seen                         wks GA)  Face:                  Orbits previously      Kidneys:                Appear normal                         seen  Lips:                  Previously seen        Bladder:                Appears normal  Thoracic:              Appears normal         Spine:                  Previously seen  Heart:                 Previously seen        Upper Extremities:      Previously seen  RVOT:                  Appears normal         Lower Extremities:      Previously seen  LVOT:                  Appears normal  Other:  Female gender. Heels and 5th digit previously visualized. ---------------------------------------------------------------------- Cervix Uterus Adnexa  Cervix  Normal appearance by transabdominal scan.  Uterus  No abnormality visualized.  Left Ovary  Not visualized.  Right Ovary  Within normal limits.  Cul De Sac:   No free fluid seen.  Adnexa:       No abnormality visualized.  ---------------------------------------------------------------------- Impression  Dichorionic-diamniotic twin gestation at 83w 2d.  Twin A:  Cephalic presentation.  Placenta anterior, above cervical os.  Appropriate fetal growth.  Normal amniotic fluid volume.  The fetal anatomic survey is complete.  Normal fetal anatomy.  No fetal anomalies or soft markers of aneuploidy seen.  Twin B:  Cephalic presentation.  Placenta posterior, above cervical os.  Appropriate fetal growth.  Normal amniotic fluid volume.  The fetal anatomic survey is complete.  Normal fetal anatomy.  No fetal anomalies or soft markers of aneuploidy seen.  5% growth discordance. ---------------------------------------------------------------------- Recommendations  Continue serial ultrasounds for fetal growth. ----------------------------------------------------------------------                   Darlyn Read, MD Electronically Signed Final Report   05/29/2018 02:27 pm ----------------------------------------------------------------------  [place] Mfm Ob Follow Up Addl Gest  Result Date: 05/29/2018 ----------------------------------------------------------------------  OBSTETRICS REPORT                      (Signed Final 05/29/2018 02:27 pm) ---------------------------------------------------------------------- Patient Info  ID #:       [id number]                          D.O.B.:  1986-11-25 (31 yrs)  Name:       [facility]               Visit Date: 05/29/2018 01:16 pm ---------------------------------------------------------------------- Performed By  Performed By:     Eden Lathe BS      Ref. Address:     [address]                    [address]                                                             [address]  Attending:        Darlyn Read MD         Location:         [facility]  Referred By:      [facility] for                    [facility]                    Healthcare  ---------------------------------------------------------------------- Orders   #  Description                                 Code   1  [place] MFM OB FOLLOW UP                         [phone]   2  [place] MFM OB FOLLOW UP ADDL GEST               14782.95  ----------------------------------------------------------------------   #  Ordered By               Order #        Accession #    Episode #   1  JEFFREY Marjo Bicker           [id number]      [id number]     [id number]   2  JEFFREY DENNEY           [id number]      [id number]     [id number]  ---------------------------------------------------------------------- Indications   [redacted] weeks gestation of pregnancy                Z3A.71   Twin pregnancy, di/di, third trimester         O30.043   Encounter for other antenatal screening        Z36.2   follow-up  ---------------------------------------------------------------------- OB History  Gravidity:    5         Term:   4  Living:       4 ---------------------------------------------------------------------- Fetal Evaluation (  Fetus A)  Num Of Fetuses:     2  Fetal Heart         147  Rate(bpm):  Cardiac Activity:   Observed  Fetal Lie:          Maternal left side  Presentation:       Cephalic  Placenta:           Anterior, above cervical os  P. Cord Insertion:  Visualized  Amniotic Fluid  AFI FV:      Subjectively within normal limits                              Largest Pocket(cm)                              7.94 ---------------------------------------------------------------------- Biometry (Fetus A)  BPD:        76  mm     G. Age:  30w 4d         18  %    CI:        73.12   %    70 - 86                                                          FL/HC:      20.7   %    19.3 - 21.3  HC:      282.5  mm     G. Age:  31w 0d         10  %    HC/AC:      1.01        0.96 - 1.17  AC:      279.3  mm     G. Age:  32w 0d         68  %    FL/BPD:     77.0   %    71 - 87  FL:       58.5  mm     G. Age:  30w 4d         19  %    FL/AC:      20.9   %     20 - 24  HUM:      48.1  mm     G. Age:  28w 1d        < 5  %  Est. FW:    1743  gm    3 lb 13 oz      56  %     FW Discordancy         5  % ---------------------------------------------------------------------- Gestational Age (Fetus A)  LMP:           31w 2d        Date:  10/22/17                 EDD:   07/29/18  U/S Today:     31w 0d  EDD:   07/31/18  Best:          31w 2d     Det. By:  LMP  (10/22/17)          EDD:   07/29/18 ---------------------------------------------------------------------- Anatomy (Fetus A)  Cranium:               Appears normal         Aortic Arch:            Previously seen  Cavum:                 Appears normal         Ductal Arch:            Previously seen  Ventricles:            Appears normal         Diaphragm:              Appears normal  Choroid Plexus:        Previously seen        Stomach:                Appears normal, left                                                                        sided  Cerebellum:            Previously seen        Abdomen:                Appears normal  Posterior Fossa:       Previously seen        Abdominal Wall:         Previously seen  Nuchal Fold:           Not applicable (>20    Cord Vessels:           Previously seen                         wks GA)  Face:                  Profile nl; orbits     Kidneys:                Appear normal                         prev visualized  Lips:                  Appears normal         Bladder:                Appears normal  Thoracic:              Appears normal         Spine:                  Previously seen  Heart:                 Previously seen        Upper Extremities:  Previously seen  RVOT:                  Previously seen        Lower Extremities:      Previously seen  LVOT:                  Appears normal  Other:  Female gender. Heels previously visualized. ---------------------------------------------------------------------- Fetal Evaluation (Fetus B)  Num  Of Fetuses:     2  Fetal Heart         143  Rate(bpm):  Cardiac Activity:   Observed  Fetal Lie:          Maternal right side  Presentation:       Cephalic  Placenta:           Posterior, above cervical os  P. Cord Insertion:  Previously Visualized  Membrane Desc:      Dividing Membrane seen - Dichorionic.  Amniotic Fluid  AFI FV:      Subjectively within normal limits                              Largest Pocket(cm)                              7.93 ---------------------------------------------------------------------- Biometry (Fetus B)  BPD:      76.7  mm     G. Age:  30w 5d         24  %    CI:        69.45   %    70 - 86                                                          FL/HC:      20.9   %    19.3 - 21.3  HC:      293.8  mm     G. Age:  32w 3d         45  %    HC/AC:      1.06        0.96 - 1.17  AC:       277   mm     G. Age:  31w 6d         61  %    FL/BPD:     79.9   %    71 - 87  FL:       61.3  mm     G. Age:  31w 6d         51  %    FL/AC:      22.1   %    20 - 24  HUM:      49.6  mm     G. Age:  29w 1d          5  %  Est. FW:    1832  gm      4 lb 1 oz     65  %     FW Discordancy      0 \ 5 % ---------------------------------------------------------------------- Gestational Age (Fetus B)  LMP:  31w 2d        Date:  10/22/17                 EDD:   07/29/18  U/S Today:     31w 5d                                        EDD:   07/26/18  Best:          31w 2d     Det. By:  LMP  (10/22/17)          EDD:   07/29/18 ---------------------------------------------------------------------- Anatomy (Fetus B)  Cranium:               Appears normal         Aortic Arch:            Previously seen  Cavum:                 Previously seen        Ductal Arch:            Previously seen  Ventricles:            Appears normal         Diaphragm:              Appears normal  Choroid Plexus:        Previously seen        Stomach:                Appears normal, left                                                                         sided  Cerebellum:            Previously seen        Abdomen:                Appears normal  Posterior Fossa:       Previously seen        Abdominal Wall:         Previously seen  Nuchal Fold:           Not applicable (>20    Cord Vessels:           Previously seen                         wks GA)  Face:                  Orbits previously      Kidneys:                Appear normal                         seen  Lips:                  Previously seen        Bladder:                Appears  normal  Thoracic:              Appears normal         Spine:                  Previously seen  Heart:                 Previously seen        Upper Extremities:      Previously seen  RVOT:                  Appears normal         Lower Extremities:      Previously seen  LVOT:                  Appears normal  Other:  Female gender. Heels and 5th digit previously visualized. ---------------------------------------------------------------------- Cervix Uterus Adnexa  Cervix  Normal appearance by transabdominal scan.  Uterus  No abnormality visualized.  Left Ovary  Not visualized.  Right Ovary  Within normal limits.  Cul De Sac:   No free fluid seen.  Adnexa:       No abnormality visualized. ---------------------------------------------------------------------- Impression  Dichorionic-diamniotic twin gestation at 63w 2d.  Twin A:  Cephalic presentation.  Placenta anterior, above cervical os.  Appropriate fetal growth.  Normal amniotic fluid volume.  The fetal anatomic survey is complete.  Normal fetal anatomy.  No fetal anomalies or soft markers of aneuploidy seen.  Twin B:  Cephalic presentation.  Placenta posterior, above cervical os.  Appropriate fetal growth.  Normal amniotic fluid volume.  The fetal anatomic survey is complete.  Normal fetal anatomy.  No fetal anomalies or soft markers of aneuploidy seen.  5% growth discordance. ----------------------------------------------------------------------  Recommendations  Continue serial ultrasounds for fetal growth. ----------------------------------------------------------------------                   Darlyn Read, MD Electronically Signed Final Report   05/29/2018 02:27 pm ----------------------------------------------------------------------   Assessment and Plan:  Pregnancy: [id number] at [redacted]w[redacted]d  1. Dichorionic diamniotic twin pregnancy in third trimester Concordant growth, doing well.  Kenalog prescribed to help with stretch marks. - triamcinolone cream (KENALOG) 0.5 %; Apply 1 application topically 3 (three) times daily.  Dispense: 30 g; Refill: 0 Continue serial growth scans; weekly BPP to start at 35 weeks.  2. Insomnia due to medical condition Ambien prescribed, warned about dependence - zolpidem (AMBIEN) 5 MG tablet; Take 1 tablet (5 mg total) by mouth at bedtime as needed for sleep.  Dispense: 30 tablet; Refill: 1  3. Supervision of high risk pregnancy, antepartum Patient to stop working now, letter provided for her for her work place. Preterm labor symptoms and general obstetric precautions including but not limited to vaginal bleeding, contractions, leaking of fluid and fetal movement were reviewed in detail with the patient. Please refer to After Visit Summary for other counseling recommendations.  Return in about 2 weeks (around 06/16/2018) for OB Visit.  Future Appointments  Date Time Provider Department Center  06/26/2018 11:30 AM [facility] [place] 5 [facility]    Jaynie Collins, MD

## 2018-06-02 NOTE — Patient Instructions (Signed)
Return to clinic for any scheduled appointments or obstetric concerns, or go to [facility] for evaluation  

## 2018-06-16 ENCOUNTER — Ambulatory Visit (INDEPENDENT_AMBULATORY_CARE_PROVIDER_SITE_OTHER): Payer: Managed Care, Other (non HMO) | Admitting: Obstetrics and Gynecology

## 2018-06-16 VITALS — BP 118/84 | HR 105 | Wt 165.4 lb

## 2018-06-16 DIAGNOSIS — R768 Other specified abnormal immunological findings in serum: Secondary | ICD-10-CM

## 2018-06-16 DIAGNOSIS — O26899 Other specified pregnancy related conditions, unspecified trimester: Secondary | ICD-10-CM

## 2018-06-16 DIAGNOSIS — O30043 Twin pregnancy, dichorionic/diamniotic, third trimester: Secondary | ICD-10-CM

## 2018-06-16 DIAGNOSIS — Z6791 Unspecified blood type, Rh negative: Secondary | ICD-10-CM

## 2018-06-16 DIAGNOSIS — O0990 Supervision of high risk pregnancy, unspecified, unspecified trimester: Secondary | ICD-10-CM

## 2018-06-16 NOTE — Progress Notes (Signed)
Prenatal Visit Note Date: 06/16/2018 Clinic: [facility]  Subjective:  Tara [name] is a 32 y.o. G5P4004 at 6738w6d being seen today for ongoing prenatal care.  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  Patient reports lethargy, uncomfortable; wondering about when can be induced and if can be induced early. Pt states that doesn't have an appetite. No n/v Contractions: Irregular. Vag. Bleeding: None.  Movement: Present. Denies leaking of fluid.   The following portions of the patient's history were reviewed and updated as appropriate: allergies, current medications, past family history, past medical history, past social history, past surgical history and problem list. Problem list updated.  Objective:   Vitals:   06/16/18 1319  BP: 118/84  Pulse: (!) 105  Weight: 165 lb 6.4 oz (75 kg)    Fetal Status: Fetal Heart Rate (bpm): 137/148   Movement: Present     General:  Alert, oriented and cooperative. Patient is in no acute distress.  Skin: Skin is warm and dry. No rash noted.   Cardiovascular: Normal heart rate noted  Respiratory: Normal respiratory effort, no problems with respiration noted  Abdomen: Soft, gravid, appropriate for gestational age. Pain/Pressure: Present     Pelvic:  Cervical exam deferred        Extremities: Normal range of motion.  Edema: None  Mental Status: Normal mood and affect. Normal behavior. Normal judgment and thought content.   Urinalysis:      Assessment and Plan:  Pregnancy: G5P4004 at 1038w6d  1. Supervision of high risk pregnancy, antepartum Routine care. btl papers utd. Pt amenable to seeing nutrition to see if can help her with her diet and weight  loss.   2. Rh negative state in antepartum period Rpt rhogam pp prn  3. Dichorionic diamniotic twin pregnancy in third trimester Doing well and has surveillance growth already scheduled. D/w pt re: 38wk delivery if no other indication aside from di-di twins and rationale for this. Pt desires VD  Preterm labor symptoms and general obstetric precautions including but not limited to vaginal bleeding, contractions, leaking of fluid and fetal movement were reviewed in detail with the patient. Please refer to After Visit Summary for other counseling recommendations.  Return in about 2 weeks (around 06/30/2018) for 10-14d rob.   [place] BingPickens, Braydon Kullman, MD

## 2018-06-26 ENCOUNTER — Ambulatory Visit (HOSPITAL_COMMUNITY)
Admission: RE | Admit: 2018-06-26 | Discharge: 2018-06-26 | Disposition: A | Discharge status: Home / Self Care | Payer: Managed Care, Other (non HMO) | Source: Ambulatory Visit | Attending: Obstetrics & Gynecology | Admitting: Obstetrics & Gynecology

## 2018-06-26 ENCOUNTER — Ambulatory Visit (HOSPITAL_COMMUNITY): Payer: Managed Care, Other (non HMO)

## 2018-06-26 ENCOUNTER — Other Ambulatory Visit (HOSPITAL_COMMUNITY): Payer: Self-pay | Admitting: Obstetrics and Gynecology

## 2018-06-26 DIAGNOSIS — O30049 Twin pregnancy, dichorionic/diamniotic, unspecified trimester: Secondary | ICD-10-CM

## 2018-06-30 ENCOUNTER — Ambulatory Visit (INDEPENDENT_AMBULATORY_CARE_PROVIDER_SITE_OTHER): Payer: Managed Care, Other (non HMO) | Admitting: Family Medicine

## 2018-06-30 VITALS — BP 112/77 | HR 72 | Wt 165.8 lb

## 2018-06-30 DIAGNOSIS — B373 Candidiasis of vulva and vagina: Secondary | ICD-10-CM

## 2018-06-30 DIAGNOSIS — B3731 Acute candidiasis of vulva and vagina: Secondary | ICD-10-CM

## 2018-06-30 DIAGNOSIS — O30043 Twin pregnancy, dichorionic/diamniotic, third trimester: Secondary | ICD-10-CM

## 2018-06-30 DIAGNOSIS — O0990 Supervision of high risk pregnancy, unspecified, unspecified trimester: Secondary | ICD-10-CM

## 2018-06-30 DIAGNOSIS — O0993 Supervision of high risk pregnancy, unspecified, third trimester: Secondary | ICD-10-CM

## 2018-06-30 MED ORDER — FLUCONAZOLE 150 MG PO TABS: 150.0000 mg | ORAL_TABLET | Freq: Every day | ORAL | 2 refills | Status: DC

## 2018-06-30 NOTE — Patient Instructions (Signed)

## 2018-07-01 NOTE — Progress Notes (Signed)
   PRENATAL VISIT NOTE  Subjective:  Tara [name] is a 32 y.o. G5P4004 at 6260w0d being seen today for ongoing prenatal care.  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  Patient reports usual discomforts of pregnancy.  Contractions: Not present.  .  Movement: Present. Denies leaking of fluid.   The following portions of the patient's history were reviewed and updated as appropriate: allergies, current medications, past family history, past medical history, past social history, past surgical history and problem list. Problem list updated.  Objective:   Vitals:   06/30/18 1321  BP: 112/77  Pulse: 72  Weight: 165 lb 12.8 oz (75.2 kg)    Fetal Status: Fetal Heart Rate (bpm): 134/131   Movement: Present     General:  Alert, oriented and cooperative. Patient is in no acute distress.  Skin: Skin is warm and dry. No rash noted.   Cardiovascular: Normal heart rate noted  Respiratory: Normal respiratory effort, no problems with respiration noted  Abdomen: Soft, gravid, appropriate for gestational age.  Pain/Pressure: Present     Pelvic: Cervical exam deferred        Extremities: Normal range of motion.     Mental Status: Normal mood and affect. Normal behavior. Normal judgment and thought content.   Assessment and Plan:  Pregnancy: G5P4004 at 6560w0d  1. Supervision of high risk pregnancy, antepartum, third trimester Cultures today - Culture, beta strep (group b only) - [facility]lam[facility]  2. Dichorionic diamniotic twin pregnancy in third trimester Appropriately grown Weekly BPP's with MFM Delivery at 38 wks  3. Yeast vaginitis Presumptive treatment, cannot use topical now -  fluconazole (DIFLUCAN) 150 MG tablet; Take 1 tablet (150 mg total) by mouth daily. Repeat in 24 hours if needed  Dispense: 2 tablet; Refill: 2  Preterm labor symptoms and general obstetric precautions including but not limited to vaginal bleeding, contractions, leaking of fluid and fetal movement were reviewed in detail with the patient. Please refer to After Visit Summary for other counseling recommendations.  Return in 1 week (on 07/07/2018).  Future Appointments  Date Time Provider Department Center  07/03/2018  1:45 PM [facility] US 2 [facility]  07/07/2018  3:00 PM Reva BoresPratt, Denetria Luevanos S, MD [facility]    Reva Boresanya S Okey Zelek, MD

## 2018-07-03 ENCOUNTER — Other Ambulatory Visit (HOSPITAL_COMMUNITY): Payer: Self-pay | Admitting: *Deleted

## 2018-07-03 ENCOUNTER — Ambulatory Visit (HOSPITAL_COMMUNITY)
Admission: RE | Admit: 2018-07-03 | Discharge: 2018-07-03 | Disposition: A | Discharge status: Home / Self Care | Payer: Managed Care, Other (non HMO) | Source: Ambulatory Visit | Attending: Obstetrics & Gynecology | Admitting: Obstetrics & Gynecology

## 2018-07-03 ENCOUNTER — Encounter (HOSPITAL_COMMUNITY): Payer: Self-pay

## 2018-07-03 DIAGNOSIS — O30049 Twin pregnancy, dichorionic/diamniotic, unspecified trimester: Secondary | ICD-10-CM

## 2018-07-03 DIAGNOSIS — O30043 Twin pregnancy, dichorionic/diamniotic, third trimester: Secondary | ICD-10-CM

## 2018-07-03 DIAGNOSIS — O0990 Supervision of high risk pregnancy, unspecified, unspecified trimester: Secondary | ICD-10-CM

## 2018-07-03 DIAGNOSIS — Z362 Encounter for other antenatal screening follow-up: Secondary | ICD-10-CM | POA: Insufficient documentation

## 2018-07-03 DIAGNOSIS — Z3A36 36 weeks gestation of pregnancy: Secondary | ICD-10-CM | POA: Diagnosis not present

## 2018-07-03 DIAGNOSIS — Z6791 Unspecified blood type, Rh negative: Secondary | ICD-10-CM

## 2018-07-03 DIAGNOSIS — O26899 Other specified pregnancy related conditions, unspecified trimester: Secondary | ICD-10-CM

## 2018-07-03 NOTE — Procedures (Signed)
Marjory Liesikeama Benda 10/30/1986 2231w2d   Fetus B Non-Stress Test Interpretation for 07/03/18  Indication: Unsatisfactory BPP Sheyla Kaufhold 10/13/1986 6631w2d  Fetus A Non-Stress Test Interpretation for 07/03/18  Indication: Unsatisfactory BPP  Fetal Heart Rate A Mode: External Baseline Rate (A): 135 bpm Variability: Moderate Accelerations: 15 x 15 Decelerations: None Multiple birth?: Yes  Uterine Activity Mode: Toco Contraction Frequency (min): Irreg UC with UI noted Contraction Duration (sec): 40-60 Contraction Quality: Mild Resting Tone Palpated: Relaxed Resting Time: Adequate  Interpretation (Fetal Testing) Nonstress Test Interpretation: Reactive Comments: FHR tracing rev'd by Dr. Judeth CornfieldShankar    Fetal Heart Rate Fetus B Mode: External Baseline Rate (B): 135 BPM Variability: Moderate Accelerations: 15 x 15 Decelerations: None  Uterine Activity Mode: Toco Contraction Frequency (min): Irreg UC with UI noted Contraction Duration (sec): 40-60 Contraction Quality: Mild Resting Tone Palpated: Relaxed Resting Time: Adequate  Interpretation (Baby B - Fetal Testing) Nonstress Test Interpretation (Baby B): Reactive Comments (Baby B): FHR tracing rev'd by Dr. Judeth CornfieldShankar

## 2018-07-07 ENCOUNTER — Ambulatory Visit (INDEPENDENT_AMBULATORY_CARE_PROVIDER_SITE_OTHER): Payer: Managed Care, Other (non HMO) | Admitting: Family Medicine

## 2018-07-07 VITALS — BP 116/78 | HR 116 | Wt 164.4 lb

## 2018-07-07 DIAGNOSIS — O0990 Supervision of high risk pregnancy, unspecified, unspecified trimester: Secondary | ICD-10-CM

## 2018-07-07 DIAGNOSIS — Z113 Encounter for screening for infections with a predominantly sexual mode of transmission: Secondary | ICD-10-CM | POA: Diagnosis not present

## 2018-07-07 DIAGNOSIS — O0993 Supervision of high risk pregnancy, unspecified, third trimester: Secondary | ICD-10-CM

## 2018-07-07 DIAGNOSIS — O30043 Twin pregnancy, dichorionic/diamniotic, third trimester: Secondary | ICD-10-CM

## 2018-07-07 LAB — OB RESULTS CONSOLE GC/CHLAMYDIA: GC PROBE AMP, GENITAL: NEGATIVE

## 2018-07-07 LAB — OB RESULTS CONSOLE GBS: GBS: NEGATIVE

## 2018-07-07 NOTE — Patient Instructions (Signed)

## 2018-07-08 NOTE — Progress Notes (Signed)
   PRENATAL VISIT NOTE  Subjective:  Tara [name] is a 32 y.o. G5P4004 at 4676w0d being seen today for ongoing prenatal care.  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  Patient reports backache and burning of abdomen.  Contractions: Irritability. Vag. Bleeding: None.  Movement: Present. Denies leaking of fluid.   The following portions of the patient's history were reviewed and updated as appropriate: allergies, current medications, past family history, past medical history, past social history, past surgical history and problem list. Problem list updated.  Objective:   Vitals:   07/07/18 1507  BP: 116/78  Pulse: (!) 116  Weight: 164 lb 6.4 oz (74.6 kg)    Fetal Status: Fetal Heart Rate (bpm): 135/124   Movement: Present     General:  Alert, oriented and cooperative. Patient is in no acute distress.  Skin: Skin is warm and dry. No rash noted.   Cardiovascular: Normal heart rate noted  Respiratory: Normal respiratory effort, no problems with respiration noted  Abdomen: Soft, gravid, appropriate for gestational age.  Pain/Pressure: Present     Pelvic: Cervical exam performed Dilation: 1 Effacement (%): 80 Station: -2  Extremities: Normal range of motion.  Edema: Mild pitting, slight indentation  Mental Status: Normal mood and affect. Normal behavior. Normal judgment and thought content.   Assessment and Plan:  Pregnancy: G5P4004 at 7676w0d  1. Supervision of high risk pregnancy, antepartum Continue prenatal care.  - Culture, beta strep (group b only) - Cervicovaginal ancillary only  2. Dichorionic diamniotic twin pregnancy in third trimester 2 HR on u/s today Vtx, vtx Concordant growth Weekly  BPP IOL at 7838 wks--direct admit  Term labor symptoms and general obstetric precautions including but not limited to vaginal bleeding, contractions, leaking of fluid and fetal movement were reviewed in detail with the patient. Please refer to After Visit Summary for other counseling recommendations.  Return in 1 week (on 07/14/2018).  Future Appointments  Date Time Provider Department Center  07/10/2018  1:00 PM [facility] US 3 [facility]  07/14/2018  2:45 PM [place] BingPickens, Charlie, MD [facility]    Reva Boresanya S Emmerich Cryer, MD

## 2018-07-09 LAB — CERVICOVAGINAL ANCILLARY ONLY
Chlamydia: NEGATIVE
Neisseria Gonorrhea: NEGATIVE

## 2018-07-10 ENCOUNTER — Other Ambulatory Visit (HOSPITAL_COMMUNITY): Payer: Self-pay | Admitting: Obstetrics and Gynecology

## 2018-07-10 ENCOUNTER — Encounter (HOSPITAL_COMMUNITY): Payer: Self-pay

## 2018-07-10 ENCOUNTER — Ambulatory Visit (HOSPITAL_COMMUNITY)
Admission: RE | Admit: 2018-07-10 | Discharge: 2018-07-10 | Disposition: A | Discharge status: Home / Self Care | Payer: Managed Care, Other (non HMO) | Source: Ambulatory Visit | Attending: Obstetrics & Gynecology | Admitting: Obstetrics & Gynecology

## 2018-07-10 DIAGNOSIS — Z3A37 37 weeks gestation of pregnancy: Secondary | ICD-10-CM | POA: Insufficient documentation

## 2018-07-10 DIAGNOSIS — O30043 Twin pregnancy, dichorionic/diamniotic, third trimester: Secondary | ICD-10-CM | POA: Diagnosis present

## 2018-07-10 LAB — CULTURE, BETA STREP (GROUP B ONLY): STREP GP B CULTURE: NEGATIVE

## 2018-07-10 NOTE — ED Notes (Signed)
Pt reports falling earlier today, landed on her knees.  No injury, +FM.

## 2018-07-12 ENCOUNTER — Inpatient Hospital Stay (EMERGENCY_DEPARTMENT_HOSPITAL)
Admission: AD | Admit: 2018-07-12 | Discharge: 2018-07-13 | Disposition: A | Discharge status: Home / Self Care | Payer: Managed Care, Other (non HMO) | Source: Ambulatory Visit | Attending: Obstetrics and Gynecology | Admitting: Obstetrics and Gynecology

## 2018-07-12 ENCOUNTER — Encounter (HOSPITAL_COMMUNITY): Payer: Self-pay

## 2018-07-12 DIAGNOSIS — O30043 Twin pregnancy, dichorionic/diamniotic, third trimester: Secondary | ICD-10-CM

## 2018-07-12 DIAGNOSIS — Z3A37 37 weeks gestation of pregnancy: Secondary | ICD-10-CM

## 2018-07-12 DIAGNOSIS — R21 Rash and other nonspecific skin eruption: Secondary | ICD-10-CM

## 2018-07-12 NOTE — MAU Note (Signed)
Pt noticed rash on arms yesterday which has spread to arms  and torso.  Also feeling chills and nausea. Temp 98. BP 121/85, pulse 130s but tachycardic 130s.  Denies bleeding or LOF. +FM. DI Di twins.

## 2018-07-13 ENCOUNTER — Encounter (HOSPITAL_COMMUNITY): Payer: Self-pay | Admitting: *Deleted

## 2018-07-13 ENCOUNTER — Telehealth (HOSPITAL_COMMUNITY): Payer: Self-pay | Admitting: *Deleted

## 2018-07-13 DIAGNOSIS — O9989 Other specified diseases and conditions complicating pregnancy, childbirth and the puerperium: Secondary | ICD-10-CM

## 2018-07-13 DIAGNOSIS — O4292 Full-term premature rupture of membranes, unspecified as to length of time between rupture and onset of labor: Secondary | ICD-10-CM | POA: Diagnosis not present

## 2018-07-13 DIAGNOSIS — R21 Rash and other nonspecific skin eruption: Secondary | ICD-10-CM | POA: Diagnosis not present

## 2018-07-13 DIAGNOSIS — Z3A37 37 weeks gestation of pregnancy: Secondary | ICD-10-CM | POA: Diagnosis not present

## 2018-07-13 LAB — CBC
HCT: 35 % — ABNORMAL LOW (ref 36.0–46.0)
HEMOGLOBIN: 12.1 g/dL (ref 12.0–15.0)
MCH: 33.6 pg (ref 26.0–34.0)
MCHC: 34.6 g/dL (ref 30.0–36.0)
MCV: 97.2 fL (ref 78.0–100.0)
Platelets: 203 10*3/uL (ref 150–400)
RBC: 3.6 MIL/uL — AB (ref 3.87–5.11)
RDW: 13.8 % (ref 11.5–15.5)
WBC: 9.1 10*3/uL (ref 4.0–10.5)

## 2018-07-13 LAB — COMPREHENSIVE METABOLIC PANEL
ALK PHOS: 242 U/L — AB (ref 38–126)
ALT: 40 U/L (ref 0–44)
AST: 73 U/L — AB (ref 15–41)
Albumin: 2.6 g/dL — ABNORMAL LOW (ref 3.5–5.0)
Anion gap: 13 (ref 5–15)
CALCIUM: 8.2 mg/dL — AB (ref 8.9–10.3)
CO2: 19 mmol/L — ABNORMAL LOW (ref 22–32)
CREATININE: 0.66 mg/dL (ref 0.44–1.00)
Chloride: 105 mmol/L (ref 98–111)
Glucose, Bld: 84 mg/dL (ref 70–99)
Potassium: 2.8 mmol/L — ABNORMAL LOW (ref 3.5–5.1)
Sodium: 137 mmol/L (ref 135–145)
Total Bilirubin: 1 mg/dL (ref 0.3–1.2)
Total Protein: 5.7 g/dL — ABNORMAL LOW (ref 6.5–8.1)

## 2018-07-13 LAB — URINALYSIS, ROUTINE W REFLEX MICROSCOPIC
Bilirubin Urine: NEGATIVE
Glucose, UA: NEGATIVE mg/dL
Hgb urine dipstick: NEGATIVE
Ketones, ur: NEGATIVE mg/dL
LEUKOCYTES UA: NEGATIVE
NITRITE: NEGATIVE
PH: 7 (ref 5.0–8.0)
Protein, ur: NEGATIVE mg/dL
SPECIFIC GRAVITY, URINE: 1.001 — AB (ref 1.005–1.030)

## 2018-07-13 MED ORDER — HYDROXYZINE PAMOATE 25 MG PO CAPS: 25.0000 mg | ORAL_CAPSULE | Freq: Three times a day (TID) | ORAL | 0 refills | Status: DC | PRN

## 2018-07-13 MED ORDER — HYDROXYZINE HCL 50 MG PO TABS
50.0000 mg | ORAL_TABLET | Freq: Once | ORAL | Status: AC
  Administered 2018-07-13: 50 mg via ORAL
  Filled 2018-07-13: qty 1

## 2018-07-13 MED ORDER — TRIAMCINOLONE ACETONIDE 0.5 % EX CREA
TOPICAL_CREAM | Freq: Once | CUTANEOUS | Status: AC
  Administered 2018-07-13: 01:00:00 via TOPICAL
  Filled 2018-07-13: qty 15

## 2018-07-13 NOTE — Discharge Instructions (Signed)
Rash A rash is a change in the color of the skin. A rash can also change the way your skin feels. There are many different conditions and factors that can cause a rash. Follow these instructions at home: Pay attention to any changes in your symptoms. Follow these instructions to help with your condition: Medicine Take or apply over-the-counter and prescription medicines only as told by your health care provider. These may include:  Corticosteroid cream.  Anti-itch lotions.  Oral antihistamines.  Skin Care  Apply cool compresses to the affected areas.  Try taking a bath with: ? Epsom salts. Follow the instructions on the packaging. You can get these at your local pharmacy or grocery store. ? Baking soda. Pour a small amount into the bath as told by your health care provider. ? Colloidal oatmeal. Follow the instructions on the packaging. You can get this at your local pharmacy or grocery store.  Try applying baking soda paste to your skin. Stir water into baking soda until it reaches a paste-like consistency.  Do not scratch or rub your skin.  Avoid covering the rash. Make sure the rash is exposed to air as much as possible. General instructions  Avoid hot showers or baths, which can make itching worse. A cold shower may help.  Avoid scented soaps, detergents, and perfumes. Use gentle soaps, detergents, perfumes, and other cosmetic products.  Avoid any substance that causes your rash. Keep a journal to help track what causes your rash. Write down: ? What you eat. ? What cosmetic products you use. ? What you drink. ? What you wear. This includes jewelry.  Keep all follow-up visits as told by your health care provider. This is important. Contact a health care provider if:  You sweat at night.  You lose weight.  You urinate more than normal.  You feel weak.  You vomit.  Your skin or the whites of your eyes look yellow (jaundice).  Your skin: ? Tingles. ? Is  numb.  Your rash: ? Does not go away after several days. ? Gets worse.  You are: ? Unusually thirsty. ? More tired than normal.  You have: ? New symptoms. ? Pain in your abdomen. ? A fever. ? Diarrhea. Get help right away if:  You develop a rash that covers all or most of your body. The rash may or may not be painful.  You develop blisters that: ? Are on top of the rash. ? Grow larger or grow together. ? Are painful. ? Are inside your nose or mouth.  You develop a rash that: ? Looks like purple pinprick-sized spots all over your body. ? Has a "bull's eye" or looks like a target. ? Is not related to sun exposure, is red and painful, and causes your skin to peel. This information is not intended to replace advice given to you by your health care provider. Make sure you discuss any questions you have with your health care provider. Document Released: 11/08/2002 Document Revised: 04/23/2016 Document Reviewed: 04/05/2015 Elsevier Interactive Patient Education  2018 [employer]  

## 2018-07-13 NOTE — Telephone Encounter (Signed)
Preadmission screen  

## 2018-07-13 NOTE — MAU Provider Note (Signed)
Chief Complaint:  Pruritis and Rash   First Provider Initiated Contact with Patient 07/13/18 0006     HPI: Tara [name] is a 32 y.o. G5P4004 at 6058w5d with di/di twins who presents to maternity admissions reporting itching. Started with abdominal itching, predominately of striae, a few weeks ago. Itching & rash has spread since yesterday. Reports extreme itching of her bilateral arm, neck, and upper abdomen. Unsure if rash or itching came first. Denies change in diet, medications, detergent, soap, lotions. Does not have pets. No recent travel. Has been using calamine lotion & took benadryl without relief.   Denies contractions, leakage of fluid or vaginal bleeding. Good fetal movement.  Past Medical History:  Diagnosis Date  . Menorrhagia with regular cycle 12/17/2016  . Vaginal Pap smear, abnormal    OB History  Gravida Para Term Preterm AB Living  5 4 4  0 0 4  SAB TAB Ectopic Multiple Live Births  0 0 0 0 4    # Outcome Date GA Lbr Len/2nd Weight Sex Delivery Anes PTL Lv  5 Current           4 Term 05/13/14 8775w0d / 00:06 3731 g M Vag-Spont EPI  LIV  3 Term 2013 10975w0d  3175 g M Vag-Spont   LIV  2 Term 09/04/09 4668w0d  3147 g M Vag-Spont EPI  LIV  1 Term 08/04/08 6768w0d  3147 g F Vag-Spont EPI  LIV   Past Surgical History:  Procedure Laterality Date  . COLPOSCOPY     Family History  Problem Relation Age of Onset  . Hypertension Mother   . Arthritis Maternal Grandmother   . Alcohol abuse Neg Hx   . Asthma Neg Hx   . Birth defects Neg Hx   . Cancer Neg Hx   . COPD Neg Hx   . Diabetes Neg Hx   . Depression Neg Hx   . Drug abuse Neg Hx   . Early death Neg Hx   . Hearing loss Neg Hx   . Heart disease Neg Hx   . Hyperlipidemia Neg Hx   . Kidney disease Neg Hx   . Learning disabilities Neg Hx   . Mental illness Neg Hx   . Mental retardation Neg Hx   . Miscarriages / Stillbirths Neg Hx   . Stroke Neg Hx   . Vision loss Neg Hx   . Varicose Veins Neg Hx    Social History    Tobacco Use  . Smoking status: Never Smoker  . Smokeless tobacco: Never Used  Substance Use Topics  . Alcohol use: No    Alcohol/week: 0.0 standard drinks  . Drug use: No   No Known Allergies No medications prior to admission.    I have reviewed patient's Past Medical Hx, Surgical Hx, Family Hx, Social Hx, medications and allergies.   ROS:  Review of Systems  Constitutional: Positive for chills. Negative for fatigue and fever.  Gastrointestinal: Negative.   Genitourinary: Negative.   Skin: Positive for rash.       + pruritus    Physical Exam   Patient Vitals for the past 24 hrs:  BP Temp Temp src Pulse Resp SpO2  07/13/18 0209 106/64 - - - - -  07/13/18 0158 106/64 - - (!) 102 - -  07/13/18 0157 106/64 - - (!) 102 16 -  07/13/18 0014 - - - - - 99 %  07/12/18 2359 - - - - - 99 %  07/12/18 2349 - - - - -  99 %  07/12/18 2344 - - - - - 100 %  07/12/18 2339 - - - - - 100 %  07/12/18 2338 121/85 98 F (36.7 C) Oral (!) 130 16 -    Constitutional: Well-developed, well-nourished female in no acute distress.  Cardiovascular: normal rate & rhythm, no murmur Respiratory: normal effort, lung sounds clear throughout GI: Abd soft, non-tender, gravid appropriate for gestational age. Pos BS x 4 MS: Extremities nontender, no edema, normal ROM Neurologic: Alert and oriented x 4.  Skin: Rash on bilateral arms extending to wrists, anterior neck, upper abdomen. Macular &  erythematous. No lesions on palms or lower extremities.  GU:      Pelvic: NEFG, physiologic discharge, no blood, cervix clean.   Dilation: 1 Effacement (%): 80 Station: -2 Presentation: Vertex Exam by:: [email or web address]  Fetal Tracing: Baby A Baseline: 135 Variability: moderate Accelerations: 15x15 Decelerations: none  Baby B Baseline: 125 Variability: moderate Accelerations: 15x15 Decelerations: none  Toco: irr ctx    Labs: Results for orders placed or performed during the hospital encounter of 07/12/18  (from the past 24 hour(s))  Urinalysis, Routine w reflex microscopic     Status: Abnormal   Collection Time: 07/13/18 12:05 AM  Result Value Ref Range   Color, Urine YELLOW YELLOW   APPearance CLEAR CLEAR   Specific Gravity, Urine 1.001 (L) 1.005 - 1.030   pH 7.0 5.0 - 8.0   Glucose, UA NEGATIVE NEGATIVE mg/dL   Hgb urine dipstick NEGATIVE NEGATIVE   Bilirubin Urine NEGATIVE NEGATIVE   Ketones, ur NEGATIVE NEGATIVE mg/dL   Protein, ur NEGATIVE NEGATIVE mg/dL   Nitrite NEGATIVE NEGATIVE   Leukocytes, UA NEGATIVE NEGATIVE  CBC     Status: Abnormal   Collection Time: 07/13/18  1:01 AM  Result Value Ref Range   WBC 9.1 4.0 - 10.5 K/uL   RBC 3.60 (L) 3.87 - 5.11 MIL/uL   Hemoglobin 12.1 12.0 - 15.0 g/dL   HCT 11.935.0 (L) 14.736.0 - 82.946.0 %   MCV 97.2 78.0 - 100.0 fL   MCH 33.6 26.0 - 34.0 pg   MCHC 34.6 30.0 - 36.0 g/dL   RDW 56.213.8 13.011.5 - 86.515.5 %   Platelets 203 150 - 400 K/uL  Comprehensive metabolic panel     Status: Abnormal   Collection Time: 07/13/18  1:01 AM  Result Value Ref Range   Sodium 137 135 - 145 mmol/L   Potassium 2.8 (L) 3.5 - 5.1 mmol/L   Chloride 105 98 - 111 mmol/L   CO2 19 (L) 22 - 32 mmol/L   Glucose, Bld 84 70 - 99 mg/dL   BUN <5 (L) 6 - 20 mg/dL   Creatinine, Ser 7.840.66 0.44 - 1.00 mg/dL   Calcium 8.2 (L) 8.9 - 10.3 mg/dL   Total Protein 5.7 (L) 6.5 - 8.1 g/dL   Albumin 2.6 (L) 3.5 - 5.0 g/dL   AST 73 (H) 15 - 41 U/L   ALT 40 0 - 44 U/L   Alkaline Phosphatase 242 (H) 38 - 126 U/L   Total Bilirubin 1.0 0.3 - 1.2 mg/dL   GFR calc non Af Amer >60 >60 mL/min   GFR calc Af Amer >60 >60 mL/min   Anion gap 13 5 - 15    Imaging:  No results found.  MAU Course: Orders Placed This Encounter  Procedures  . Urinalysis, Routine w reflex microscopic  . CBC  . Comprehensive metabolic panel  . Discharge patient   Meds ordered this encounter  Medications  . hydrOXYzine (ATARAX/VISTARIL) tablet 50 mg  . triamcinolone cream (KENALOG) 0.5 %  . hydrOXYzine (VISTARIL)  25 MG capsule    Sig: Take 1 capsule (25 mg total) by mouth 3 (three) times daily as needed.    Dispense:  10 capsule    Refill:  0    Order Specific Question:   Supervising Provider    Answer:   Galen Daft    MDM: Vistaril 50 mg PO & triamcinolone cream topically Bile acids pending Pt normotensive & no protein in urine -- discussed elevated AST w/Dr. Emelda Fear Pt requesting to be discharged home after meds given. Has f/u in office tomorrow (8/13)  Assessment: 1. Rash of unknown cause   2. Dichorionic diamniotic twin pregnancy in third trimester   3. [redacted] weeks gestation of pregnancy     Plan: Discharge home in stable condition.     Allergies as of 07/13/2018   No Known Allergies     Medication List    TAKE these medications   acetaminophen 325 MG tablet Commonly known as:  TYLENOL Take 650 mg by mouth every 6 (six) hours as needed.   aspirin EC 81 MG tablet Take 1 tablet (81 mg total) by mouth daily. Take after 12 weeks for prevention of preeclampsia later in pregnancy   hydrOXYzine 25 MG capsule Commonly known as:  VISTARIL Take 1 capsule (25 mg total) by mouth 3 (three) times daily as needed.   Prenatal Vitamins 0.8 MG tablet Take 1 tablet by mouth daily.   ranitidine 150 MG capsule Commonly known as:  ZANTAC Take 1 capsule (150 mg total) by mouth 2 (two) times daily.   zolpidem 5 MG tablet Commonly known as:  AMBIEN Take 1 tablet (5 mg total) by mouth at bedtime as needed for sleep.       Judeth Horn, NP 07/13/2018 6:54 AM

## 2018-07-14 ENCOUNTER — Telehealth: Payer: Self-pay | Admitting: Radiology

## 2018-07-14 ENCOUNTER — Inpatient Hospital Stay (HOSPITAL_COMMUNITY)
Admission: AD | Admit: 2018-07-14 | Discharge: 2018-07-16 | DRG: 805 | Disposition: A | Discharge status: Home / Self Care | Payer: Managed Care, Other (non HMO) | Attending: Family Medicine | Admitting: Family Medicine

## 2018-07-14 ENCOUNTER — Encounter: Payer: Managed Care, Other (non HMO) | Admitting: Obstetrics and Gynecology

## 2018-07-14 ENCOUNTER — Inpatient Hospital Stay (HOSPITAL_COMMUNITY): Payer: Managed Care, Other (non HMO) | Admitting: Anesthesiology

## 2018-07-14 ENCOUNTER — Encounter (HOSPITAL_COMMUNITY): Payer: Self-pay | Admitting: *Deleted

## 2018-07-14 DIAGNOSIS — O30043 Twin pregnancy, dichorionic/diamniotic, third trimester: Secondary | ICD-10-CM

## 2018-07-14 DIAGNOSIS — O30003 Twin pregnancy, unspecified number of placenta and unspecified number of amniotic sacs, third trimester: Secondary | ICD-10-CM | POA: Diagnosis present

## 2018-07-14 DIAGNOSIS — Z6791 Unspecified blood type, Rh negative: Secondary | ICD-10-CM

## 2018-07-14 DIAGNOSIS — O0990 Supervision of high risk pregnancy, unspecified, unspecified trimester: Secondary | ICD-10-CM

## 2018-07-14 DIAGNOSIS — O4593 Premature separation of placenta, unspecified, third trimester: Secondary | ICD-10-CM | POA: Diagnosis present

## 2018-07-14 DIAGNOSIS — O4292 Full-term premature rupture of membranes, unspecified as to length of time between rupture and onset of labor: Principal | ICD-10-CM | POA: Diagnosis present

## 2018-07-14 DIAGNOSIS — O9089 Other complications of the puerperium, not elsewhere classified: Secondary | ICD-10-CM | POA: Diagnosis present

## 2018-07-14 DIAGNOSIS — O26893 Other specified pregnancy related conditions, third trimester: Secondary | ICD-10-CM | POA: Diagnosis present

## 2018-07-14 DIAGNOSIS — Z3A37 37 weeks gestation of pregnancy: Secondary | ICD-10-CM

## 2018-07-14 DIAGNOSIS — O30049 Twin pregnancy, dichorionic/diamniotic, unspecified trimester: Secondary | ICD-10-CM | POA: Diagnosis present

## 2018-07-14 DIAGNOSIS — O26899 Other specified pregnancy related conditions, unspecified trimester: Secondary | ICD-10-CM

## 2018-07-14 DIAGNOSIS — O4202 Full-term premature rupture of membranes, onset of labor within 24 hours of rupture: Secondary | ICD-10-CM | POA: Diagnosis not present

## 2018-07-14 DIAGNOSIS — L299 Pruritus, unspecified: Secondary | ICD-10-CM | POA: Diagnosis present

## 2018-07-14 LAB — CBC
HCT: 33.9 % — ABNORMAL LOW (ref 36.0–46.0)
Hemoglobin: 12 g/dL (ref 12.0–15.0)
MCH: 34.4 pg — AB (ref 26.0–34.0)
MCHC: 35.4 g/dL (ref 30.0–36.0)
MCV: 97.1 fL (ref 78.0–100.0)
Platelets: 189 10*3/uL (ref 150–400)
RBC: 3.49 MIL/uL — ABNORMAL LOW (ref 3.87–5.11)
RDW: 14 % (ref 11.5–15.5)
WBC: 7.6 10*3/uL (ref 4.0–10.5)

## 2018-07-14 LAB — BILE ACIDS, TOTAL: Bile Acids Total: 3 umol/L (ref 0.0–10.0)

## 2018-07-14 LAB — POCT FERN TEST: POCT FERN TEST: POSITIVE

## 2018-07-14 MED ORDER — PHENYLEPHRINE 40 MCG/ML (10ML) SYRINGE FOR IV PUSH (FOR BLOOD PRESSURE SUPPORT): 80.0000 ug | PREFILLED_SYRINGE | INTRAVENOUS | Status: DC | PRN

## 2018-07-14 MED ORDER — LIDOCAINE HCL (PF) 1 % IJ SOLN
INTRAMUSCULAR | Status: DC | PRN
  Administered 2018-07-14: 5 mL via EPIDURAL

## 2018-07-14 MED ORDER — LIDOCAINE HCL (PF) 1 % IJ SOLN
30.0000 mL | INTRAMUSCULAR | Status: DC | PRN
  Filled 2018-07-14: qty 30

## 2018-07-14 MED ORDER — LACTATED RINGERS IV SOLN: 500.0000 mL | INTRAVENOUS | Status: DC | PRN

## 2018-07-14 MED ORDER — FENTANYL 2.5 MCG/ML BUPIVACAINE 1/10 % EPIDURAL INFUSION (WH - ANES)
14.0000 mL/h | INTRAMUSCULAR | Status: DC | PRN
  Administered 2018-07-14: 14 mL/h via EPIDURAL

## 2018-07-14 MED ORDER — ACETAMINOPHEN 325 MG PO TABS: 650.0000 mg | ORAL_TABLET | ORAL | Status: DC | PRN

## 2018-07-14 MED ORDER — SOD CITRATE-CITRIC ACID 500-334 MG/5ML PO SOLN: 30.0000 mL | ORAL | Status: DC | PRN

## 2018-07-14 MED ORDER — FLEET ENEMA 7-19 GM/118ML RE ENEM: 1.0000 | ENEMA | RECTAL | Status: DC | PRN

## 2018-07-14 MED ORDER — PHENYLEPHRINE 40 MCG/ML (10ML) SYRINGE FOR IV PUSH (FOR BLOOD PRESSURE SUPPORT)
PREFILLED_SYRINGE | INTRAVENOUS | Status: AC
  Filled 2018-07-14: qty 20

## 2018-07-14 MED ORDER — OXYCODONE-ACETAMINOPHEN 5-325 MG PO TABS: 2.0000 | ORAL_TABLET | ORAL | Status: DC | PRN

## 2018-07-14 MED ORDER — DIPHENHYDRAMINE HCL 50 MG/ML IJ SOLN
12.5000 mg | INTRAMUSCULAR | Status: DC | PRN
  Administered 2018-07-14: 12.5 mg via INTRAVENOUS
  Filled 2018-07-14: qty 1

## 2018-07-14 MED ORDER — OXYTOCIN BOLUS FROM INFUSION
500.0000 mL | Freq: Once | INTRAVENOUS | Status: AC
  Administered 2018-07-15: 500 mL via INTRAVENOUS

## 2018-07-14 MED ORDER — LACTATED RINGERS IV SOLN
500.0000 mL | Freq: Once | INTRAVENOUS | Status: AC
  Administered 2018-07-14: 500 mL via INTRAVENOUS

## 2018-07-14 MED ORDER — OXYTOCIN 40 UNITS IN LACTATED RINGERS INFUSION - SIMPLE MED
2.5000 [IU]/h | INTRAVENOUS | Status: DC
  Filled 2018-07-14: qty 1000

## 2018-07-14 MED ORDER — LACTATED RINGERS IV SOLN
INTRAVENOUS | Status: DC
  Administered 2018-07-14: 14:00:00 via INTRAVENOUS

## 2018-07-14 MED ORDER — FENTANYL 2.5 MCG/ML BUPIVACAINE 1/10 % EPIDURAL INFUSION (WH - ANES)
INTRAMUSCULAR | Status: AC
  Filled 2018-07-14: qty 100

## 2018-07-14 MED ORDER — EPHEDRINE 5 MG/ML INJ: 10.0000 mg | INTRAVENOUS | Status: DC | PRN

## 2018-07-14 MED ORDER — OXYCODONE-ACETAMINOPHEN 5-325 MG PO TABS: 1.0000 | ORAL_TABLET | ORAL | Status: DC | PRN

## 2018-07-14 MED ORDER — ONDANSETRON HCL 4 MG/2ML IJ SOLN: 4.0000 mg | Freq: Four times a day (QID) | INTRAMUSCULAR | Status: DC | PRN

## 2018-07-14 MED ORDER — LACTATED RINGERS IV SOLN: 500.0000 mL | Freq: Once | INTRAVENOUS | Status: DC

## 2018-07-14 MED ORDER — MISOPROSTOL 25 MCG QUARTER TABLET: 25.0000 ug | ORAL_TABLET | ORAL | Status: DC | PRN

## 2018-07-14 MED ORDER — TERBUTALINE SULFATE 1 MG/ML IJ SOLN: 0.2500 mg | Freq: Once | INTRAMUSCULAR | Status: DC | PRN

## 2018-07-14 NOTE — H&P (Signed)
OBSTETRIC ADMISSION HISTORY AND PHYSICAL  Tara [name] is a 32 y.o. female [phone] with IUP at [redacted]w[redacted]d by L/18 presenting for SROM with spontaneous onset of labor. She reports +FMs, No LOF, no VB, no blurry vision, headaches or peripheral edema, or RUQ pain. She plans on both breast and bottle feeding. She requests BTL for birth control. She received her prenatal care at [facility]   Prenatal History/Complications:  Rh negative  Dichorionic diamniotic twin pregnancy  False positive RPR  ASCUS (-) HR HPV  Past Medical History: Past Medical History:  Diagnosis Date  . Menorrhagia with regular cycle 12/17/2016  . Vaginal Pap smear, abnormal    Past Surgical History: Past Surgical History:  Procedure Laterality Date  . COLPOSCOPY     Obstetrical History: OB History    Gravida  5   Para  4   Term  4   Preterm  0   AB  0   Living  4     SAB  0   TAB  0   Ectopic  0   Multiple  0   Live Births  4          Social History: Social History   Socioeconomic History  . Marital status: Married    Spouse name: Not on file  . Number of children: Not on file  . Years of education: Not on file  . Highest education level: Not on file  Occupational History  . Not on file  Social Needs  . Financial resource strain: Not hard at all  . Food insecurity:    Worry: Never true    Inability: Never true  . Transportation needs:    Medical: No    Non-medical: No  Tobacco Use  . Smoking status: Never Smoker  . Smokeless tobacco: Never Used  Substance and Sexual Activity  . Alcohol use: No    Alcohol/week: 0.0 standard drinks  . Drug use: No  . Sexual activity: Yes    Birth control/protection: None  Lifestyle  . Physical activity:    Days per week: Not on file    Minutes per session: Not on file  . Stress: Not on file  Relationships  . Social connections:    Talks on phone: Not on file    Gets together: Not on file    Attends religious service: Not on  file    Active member of club or organization: Not on file    Attends meetings of clubs or organizations: Not on file    Relationship status: Not on file  Other Topics Concern  . Not on file  Social History Narrative  . Not on file    Family History: Family History  Problem Relation Age of Onset  . Hypertension Mother   . Arthritis Maternal Grandmother   . Alcohol abuse Neg Hx   . Asthma Neg Hx   . Birth defects Neg Hx   . Cancer Neg Hx   . COPD Neg Hx   . Diabetes Neg Hx   . Depression Neg Hx   . Drug abuse Neg Hx   . Early death Neg Hx   . Hearing loss Neg Hx   . Heart disease Neg Hx   . Hyperlipidemia Neg Hx   . Kidney disease Neg Hx   . Learning disabilities Neg Hx   . Mental illness Neg Hx   . Mental retardation Neg Hx   . Miscarriages / Stillbirths Neg Hx   . Stroke  Neg Hx   . Vision loss Neg Hx   . Varicose Veins Neg Hx     Allergies: No Known Allergies  Medications Prior to Admission  Medication Sig Dispense Refill Last Dose  . acetaminophen (TYLENOL) 325 MG tablet Take 650 mg by mouth every 6 (six) hours as needed.   Taking  . aspirin EC 81 MG tablet Take 1 tablet (81 mg total) by mouth daily. Take after 12 weeks for prevention of preeclampsia later in pregnancy 300 tablet 2 07/12/2018 at Unknown time  . hydrOXYzine (VISTARIL) 25 MG capsule Take 1 capsule (25 mg total) by mouth 3 (three) times daily as needed. 10 capsule 0   . Prenatal Multivit-Min-Fe-FA (PRENATAL VITAMINS) 0.8 MG tablet Take 1 tablet by mouth daily. 30 tablet 12 07/12/2018 at Unknown time  . ranitidine (ZANTAC) 150 MG capsule Take 1 capsule (150 mg total) by mouth 2 (two) times daily. 60 capsule 2 07/12/2018 at Unknown time  . zolpidem (AMBIEN) 5 MG tablet Take 1 tablet (5 mg total) by mouth at bedtime as needed for sleep. 30 tablet 1 07/11/2018 at Unknown time     Review of Systems   All systems reviewed and negative except as stated in HPI  Blood pressure 113/81, pulse (!) 120,  temperature 98 F (36.7 C), resp. rate 16, height 5' 3.5" (1.613 m), weight 73.7 kg, last menstrual period 10/22/2017. General appearance: alert, cooperative and appears uncomfortable Lungs: no respiratory distress Heart: regular heart rate  Abdomen: soft, non-tender; gravid Pelvic: deferred, done by MAU provider with gross pooling Extremities: Homans sign is negative, no sign of DVT Presentation: reported to be vertex/vertex by scan about a week ago, will repeat BSUS Fetal monitoring: A (140s  mod  +a  -d) B (140s  mod  +a  -d) Uterine activity: irregular   Prenatal labs: ABO, Rh: O/Negative/-- (01/28 1455) Antibody: Negative (06/24 0838) Rubella: 1.27 (01/28 1455) RPR: Non Reactive (06/24 0838)  HBsAg: Negative (01/28 1455)  HIV: Non Reactive (06/24 0838)  GBS: Negative (08/06 0000)  2-hr Glucola: normal 67  177  139  Genetic screening  Low-risk NIPS  Ultrasounds  07/03/18 at 34w3: A EFW 2930g (68%), anterior placenta  B 2724g (51%), posterior placenta  03/04/18 at 19w0: normal anatomy U/S diamniotic dichorionic intrauterine pregnancy   Prenatal Transfer Tool  Maternal Diabetes: No Genetic Screening: Normal Maternal Ultrasounds/Referrals: Normal Fetal Ultrasounds or other Referrals:  Other: followed by MFM, normal growth U/S at 36 weeks  Maternal Substance Abuse:  No Significant Maternal Medications:  None Significant Maternal Lab Results: None  Results for orders placed or performed during the hospital encounter of 07/14/18 (from the past 24 hour(s))  CBC   Collection Time: 07/14/18  2:04 PM  Result Value Ref Range   WBC 7.6 4.0 - 10.5 K/uL   RBC 3.49 (L) 3.87 - 5.11 MIL/uL   Hemoglobin 12.0 12.0 - 15.0 g/dL   HCT 04.533.9 (L) 40.936.0 - 81.146.0 %   MCV 97.1 78.0 - 100.0 fL   MCH 34.4 (H) 26.0 - 34.0 pg   MCHC 35.4 30.0 - 36.0 g/dL   RDW 91.414.0 78.211.5 - 95.615.5 %   Platelets 189 150 - 400 K/uL  Fern Test   Collection Time: 07/14/18  2:12 PM  Result Value Ref Range   POCT  Fern Test Positive = ruptured amniotic membanes     Patient Active Problem List   Diagnosis Date Noted  . ASCUS of cervix with negative high risk HPV on  12/29/2017 12/31/2017  . 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 Supervision of high risk pregnancy, antepartum 12/29/2017  . Rh negative state in antepartum period 12/29/2017  . Dichorionic diamniotic twin pregnancy 12/29/2017    Assessment/Plan:  Tara [name] is a 32 y.o. G5P4004 at 6285w6d here for PROM with subsequent spontaneous onset of labor.  Labor  SROM [phone](1207 07/14/18): Expectant management. Anticipate SVD.  -- pain control with epidural -- will consider starting pitocin if irregular contractions   Fetal Wellbeing: Two females [place]alynn and [place]). EFW A 2930g (68%), B 2724g (51%)  Will confirm presentation cephalic/cephalic once BSUS available  -- GBS (-) -- continuous fetal monitoring: category I strip at this time, difficult to monitor so will consider FSE when able  Postpartum Planning:  -- f/u fetal cord blood given Rh(-) status -- desires PP BTL [employer](private insurance, papers signed 05/06/18)  -- breast/bottle feeding   Tara StandsLaurel S Loran Fleet, DO  07/14/2018, 2:49 PM

## 2018-07-14 NOTE — Anesthesia Procedure Notes (Signed)
Epidural Patient location during procedure: OB Start time: 07/14/2018 3:04 PM End time: 07/14/2018 3:20 PM  Staffing Anesthesiologist: Trevor IhaHouser, Astria Jordahl A, MD Performed: anesthesiologist   Preanesthetic Checklist Completed: patient identified, site marked, surgical consent, pre-op evaluation, timeout performed, IV checked, risks and benefits discussed and monitors and equipment checked  Epidural Patient position: sitting Prep: site prepped and draped and DuraPrep Patient monitoring: continuous pulse ox and blood pressure Approach: midline Location: L2-L3 Injection technique: LOR air  Needle:  Needle type: Tuohy  Needle gauge: 17 G Needle length: 9 cm and 9 Needle insertion depth: 5 cm cm Catheter type: closed end flexible Catheter size: 19 Gauge Catheter at skin depth: 10 cm Test dose: negative  Assessment Events: blood not aspirated, injection not painful, no injection resistance, negative IV test and no paresthesia  Additional Notes 2 attempts . Pt tolerated procedure well.

## 2018-07-14 NOTE — Telephone Encounter (Signed)
Patient called stating that she was leaking fluid, not a gush, but leaking. Explains that she does not feel as though it is urine. Consulted Dr Vergie LivingPickens, he stated for [facility] to [facility]. Patient expressed understanding.

## 2018-07-14 NOTE — Progress Notes (Signed)
OB/GYN Faculty Practice: Labor Progress Note  Subjective: Doing well, feeling itchy from epidural. Wondering about process for tubal. Sleepy, hoping to get a nap.   Objective: BP 116/81   Pulse (!) 106   Temp 98 F (36.7 C)   Resp 18   Ht 5' 3.5" (1.613 m)   Wt 73.7 kg   LMP 10/22/2017   SpO2 99%   BMI 28.32 kg/m  Gen: more comfortable appearing  Dilation: 3 Effacement (%): 50  Assessment and Plan: 32 y.o. G5P4004 726w6d here from SROM, SOL.   Labor SROM [phone](1207 07/14/18): Expectant management.Anticipate SVD. -- pain control with epidural --consider starting pitocin later if contractions space out   Fetal Wellbeing: Two females [place]alynn and [place]).EFW A 2930g (68%), B 2724g (51%)  Will confirm presentation cephalic/cephalic once BSUS available  -- GBS (-) -- continuous fetal monitoring: category I strip at this time, Baby A with FSE in place  Amillia Biffle S. Earlene PlaterWallace, DO OB/GYN Fellow 5:54 PM

## 2018-07-14 NOTE — MAU Note (Signed)
Pt presents to MAU with complaints of ROM at 1207 today. Denies any VB

## 2018-07-14 NOTE — Progress Notes (Signed)
Tara [name] is a 32 y.o. [id number] at 4358w6d admitted for SOL. Pregnancy complicated by DCDA twin gestation, Rh neg, false positive RPR.  Subjective: Patient is resting comfortably.  Objective: BP 110/86   Pulse (!) 141   Temp 97.6 F (36.4 C) (Oral)   Resp 16   Ht 5' 3.5" (1.613 m)   Wt 73.7 kg   LMP 10/22/2017   SpO2 99%   BMI 28.32 kg/m  No intake/output data recorded. No intake/output data recorded.  FHT:  Twin A:  FHR: 150 bpm, variability: moderate,  accelerations:  Present,  decelerations:  Present 1 variable, 1 late  Twin B: FHR 155bpm, moderate variability, +accels, -decels UC:   regular, every 1-2 minutes SVE:   Dilation: 6 Effacement (%): 80 Station: -2 Exam by:: Valeta HarmsAngela Anthony, RN  Labs: Lab Results  Component Value Date   WBC 7.6 07/14/2018   HGB 12.0 07/14/2018   HCT 33.9 (L) 07/14/2018   MCV 97.1 07/14/2018   PLT 189 07/14/2018    Assessment / Plan: Spontaneous labor, progressing normally.   Labor: Progressing normally Preeclampsia:  no signs or symptoms of toxicity Fetal Wellbeing:  Category I on Twin B, Cat 2 with resolved decels, now Cat 1 ROM: intact Pain Control:  Epidural I/D:  n/a Anticipated MOD:  NSVD   Tara SchatzPatricia Maricsa Sammons, DO Family Medicine, PGY-3

## 2018-07-14 NOTE — Anesthesia Pain Management Evaluation Note (Signed)
  CRNA Pain Management Visit Note  Patient: Tara [name], [age] y.o., female  "Hello I am a [facility] of the anesthesia [facility] at [facility]. We have an anesthesia team available at all times to provide care throughout the hospital, including epidural management and anesthesia for C-section. I don't know your plan for the delivery whether it a natural birth, water birth, IV sedation, nitrous supplementation, doula or epidural, but we want to meet your pain goals."   1.Was your pain managed to your expectations on prior hospitalizations?   Yes   2.What is your expectation for pain management during this hospitalization?     Epidural  3.How can we help you reach that goal? epidural  Record the patient's initial score and the patient's pain goal.   Pain: 0  Pain Goal: 4 [facility] wants you to be able to say your pain was always managed very well.  Gatha Mcnulty 07/14/2018

## 2018-07-14 NOTE — MAU Note (Signed)
Urine sent to lab 

## 2018-07-14 NOTE — MAU Provider Note (Signed)
First Provider Initiated Contact with Patient 07/14/18 1344       S: Ms. Tara [name] is a 32 y.o. G5P4004 at 7438w6d  who presents to MAU today complaining of leaking of fluid since 1207. She denies vaginal bleeding. She endorses regular painful contractions. She reports normal fetal movement.    O: BP 113/81   Pulse (!) 120   Temp 98 F (36.7 C)   Resp 16   LMP 10/22/2017  GENERAL: Well-developed, well-nourished female in no acute distress.  HEAD: Normocephalic, atraumatic.  CHEST: Normal effort of breathing, regular heart rate ABDOMEN: Soft, nontender, gravid PELVIC: Normal external female genitalia. Vagina is pink and rugated. Cervix with normal contour, no lesions. Normal discharge.  Negative pooling.   Cervical exam:   Deferred  Fetal Monitoring: Baseline A: 140 bpm Variability: moderate Accelerations: 15 x 15 Decelerations: none Baseline B: 130 bpm Variability: moderate Accelerations: 15 x 15 Decelerations: none Contractions: q 2-4 minutes   No results found for this or any previous visit (from the past 24 hour(s)). + Fern   A: SIUP at 6338w6d  SROM  P: Admit to L&D  Marny LowensteinWenzel, Tara Netto N, PA-C 07/14/2018 1:55 PM

## 2018-07-14 NOTE — Anesthesia Preprocedure Evaluation (Signed)
Anesthesia Evaluation  Patient identified by MRN, date of birth, ID band Patient awake    Reviewed: Allergy & Precautions, NPO status , Patient's Chart, lab work & pertinent test results  Airway Mallampati: II  TM Distance: >3 FB Neck ROM: Full    Dental no notable dental hx. (+) Teeth Intact   Pulmonary neg pulmonary ROS,    Pulmonary exam normal breath sounds clear to auscultation       Cardiovascular Exercise Tolerance: Good Normal cardiovascular exam Rhythm:Regular Rate:Normal     Neuro/Psych negative neurological ROS  negative psych ROS   GI/Hepatic negative GI ROS,   Endo/Other  negative endocrine ROS  Renal/GU negative Renal ROS     Musculoskeletal   Abdominal   Peds  Hematology  (+) anemia ,   Anesthesia Other Findings   Reproductive/Obstetrics (+) Pregnancy                             Lab Results  Component Value Date   WBC 7.6 07/14/2018   HGB 12.0 07/14/2018   HCT 33.9 (L) 07/14/2018   MCV 97.1 07/14/2018   PLT 189 07/14/2018    Anesthesia Physical Anesthesia Plan  ASA: III  Anesthesia Plan: Epidural   Post-op Pain Management:    Induction:   PONV Risk Score and Plan:   Airway Management Planned:   Additional Equipment:   Intra-op Plan:   Post-operative Plan:   Informed Consent: I have reviewed the patients History and Physical, chart, labs and discussed the procedure including the risks, benefits and alternatives for the proposed anesthesia with the patient or authorized representative who has indicated his/her understanding and acceptance.     Plan Discussed with: CRNA  Anesthesia Plan Comments: (Twin Gestation)        Anesthesia Quick Evaluation

## 2018-07-14 NOTE — Progress Notes (Signed)
OB/GYN Faculty Practice: Labor Progress Note  Subjective: Received epidural, feeling somewhat more comfortable, still feeling contractions. Feeling fluid continue to leak.   Objective: BP 111/76   Pulse 89   Temp 98 F (36.7 C)   Resp 18   Ht 5' 3.5" (1.613 m)   Wt 73.7 kg   LMP 10/22/2017   SpO2 99%   BMI 28.32 kg/m  Gen: lying in bed, resting Dilation: 3 Effacement (%): 50  Assessment and Plan: 32 y.o. [id number] 3238w6d here for SROM, SOL.   Labor  SROM [phone](1207 07/14/18): Expectant management. Anticipate SVD.  -- pain control with epidural -- consider starting pitocin later if contractions space out   Fetal Wellbeing: Two females [place]al[place] and [place]). EFW A 2930g (68%), B 2724g (51%)  Will confirm presentation cephalic/cephalic once BSUS available  -- GBS (-) -- continuous fetal monitoring: category I strip at this time, Baby A with FSE in place  Christine Schiefelbein S. Earlene PlaterWallace, DO OB/GYN Fellow 3:53 PM

## 2018-07-15 ENCOUNTER — Inpatient Hospital Stay (HOSPITAL_COMMUNITY): Admission: RE | Admit: 2018-07-15 | Payer: Managed Care, Other (non HMO) | Source: Ambulatory Visit

## 2018-07-15 LAB — RPR: RPR Ser Ql: NONREACTIVE

## 2018-07-15 MED ORDER — ONDANSETRON HCL 4 MG PO TABS: 4.0000 mg | ORAL_TABLET | ORAL | Status: DC | PRN

## 2018-07-15 MED ORDER — IBUPROFEN 600 MG PO TABS
600.0000 mg | ORAL_TABLET | Freq: Four times a day (QID) | ORAL | Status: DC
  Administered 2018-07-15 – 2018-07-16 (×5): 600 mg via ORAL
  Filled 2018-07-15 (×5): qty 1

## 2018-07-15 MED ORDER — RHO D IMMUNE GLOBULIN 1500 UNIT/2ML IJ SOSY
300.0000 ug | PREFILLED_SYRINGE | Freq: Once | INTRAMUSCULAR | Status: AC
  Administered 2018-07-15: 300 ug via INTRAMUSCULAR
  Filled 2018-07-15: qty 2

## 2018-07-15 MED ORDER — DIPHENHYDRAMINE HCL 25 MG PO CAPS
25.0000 mg | ORAL_CAPSULE | Freq: Four times a day (QID) | ORAL | Status: DC | PRN
  Administered 2018-07-16: 25 mg via ORAL
  Filled 2018-07-15: qty 1

## 2018-07-15 MED ORDER — SENNOSIDES-DOCUSATE SODIUM 8.6-50 MG PO TABS: 2.0000 | ORAL_TABLET | ORAL | Status: DC

## 2018-07-15 MED ORDER — COCONUT OIL OIL: 1.0000 "application " | TOPICAL_OIL | Status: DC | PRN

## 2018-07-15 MED ORDER — LACTATED RINGERS IV SOLN: INTRAVENOUS | Status: DC

## 2018-07-15 MED ORDER — FAMOTIDINE 20 MG PO TABS: 40.0000 mg | ORAL_TABLET | Freq: Once | ORAL | Status: DC

## 2018-07-15 MED ORDER — SIMETHICONE 80 MG PO CHEW: 80.0000 mg | CHEWABLE_TABLET | ORAL | Status: DC | PRN

## 2018-07-15 MED ORDER — OXYCODONE HCL 5 MG PO TABS
10.0000 mg | ORAL_TABLET | ORAL | Status: DC | PRN
  Administered 2018-07-15 – 2018-07-16 (×6): 10 mg via ORAL
  Filled 2018-07-15 (×5): qty 2

## 2018-07-15 MED ORDER — MEASLES, MUMPS & RUBELLA VAC ~~LOC~~ INJ
0.5000 mL | INJECTION | Freq: Once | SUBCUTANEOUS | Status: DC
  Filled 2018-07-15: qty 0.5

## 2018-07-15 MED ORDER — DIBUCAINE 1 % RE OINT: 1.0000 "application " | TOPICAL_OINTMENT | RECTAL | Status: DC | PRN

## 2018-07-15 MED ORDER — ACETAMINOPHEN 325 MG PO TABS
650.0000 mg | ORAL_TABLET | ORAL | Status: DC | PRN
  Administered 2018-07-15: 650 mg via ORAL
  Filled 2018-07-15: qty 2

## 2018-07-15 MED ORDER — METOCLOPRAMIDE HCL 10 MG PO TABS: 10.0000 mg | ORAL_TABLET | Freq: Once | ORAL | Status: DC

## 2018-07-15 MED ORDER — TETANUS-DIPHTH-ACELL PERTUSSIS 5-2.5-18.5 LF-MCG/0.5 IM SUSP: 0.5000 mL | Freq: Once | INTRAMUSCULAR | Status: DC

## 2018-07-15 MED ORDER — ACETAMINOPHEN 325 MG PO TABS
650.0000 mg | ORAL_TABLET | ORAL | Status: DC | PRN
  Administered 2018-07-15 – 2018-07-16 (×5): 650 mg via ORAL
  Filled 2018-07-15 (×5): qty 2

## 2018-07-15 MED ORDER — PRENATAL MULTIVITAMIN CH
1.0000 | ORAL_TABLET | Freq: Every day | ORAL | Status: DC
  Administered 2018-07-15 – 2018-07-16 (×2): 1 via ORAL
  Filled 2018-07-15 (×2): qty 1

## 2018-07-15 MED ORDER — OXYCODONE HCL 5 MG PO TABS
5.0000 mg | ORAL_TABLET | ORAL | Status: DC | PRN
Start: 2018-07-15 — End: 2018-07-16
  Filled 2018-07-15 (×2): qty 1

## 2018-07-15 MED ORDER — METHYLERGONOVINE MALEATE 0.2 MG PO TABS
0.2000 mg | ORAL_TABLET | Freq: Four times a day (QID) | ORAL | Status: AC
  Administered 2018-07-15 (×4): 0.2 mg via ORAL
  Filled 2018-07-15 (×4): qty 1

## 2018-07-15 MED ORDER — WITCH HAZEL-GLYCERIN EX PADS
1.0000 "application " | MEDICATED_PAD | CUTANEOUS | Status: DC | PRN
  Administered 2018-07-15: 1 via TOPICAL

## 2018-07-15 MED ORDER — ZOLPIDEM TARTRATE 5 MG PO TABS: 5.0000 mg | ORAL_TABLET | Freq: Every evening | ORAL | Status: DC | PRN

## 2018-07-15 MED ORDER — BENZOCAINE-MENTHOL 20-0.5 % EX AERO
1.0000 "application " | INHALATION_SPRAY | CUTANEOUS | Status: DC | PRN
  Administered 2018-07-15: 1 via TOPICAL
  Filled 2018-07-15: qty 56

## 2018-07-15 MED ORDER — ONDANSETRON HCL 4 MG/2ML IJ SOLN: 4.0000 mg | INTRAMUSCULAR | Status: DC | PRN

## 2018-07-15 MED ORDER — OXYCODONE HCL 5 MG PO TABS: 5.0000 mg | ORAL_TABLET | ORAL | Status: DC | PRN

## 2018-07-15 NOTE — Lactation Note (Signed)
This note was copied from a baby's chart. Lactation Consultation Note Twins 5 hrs old. Mom hasn't Bf any of her other children, has decided to BF/formula feed.  LC went to see mom, mom states she id very tired, please com back later.  Patient Name: Tara [name] [id number]'s Date: 07/15/2018     Maternal Data    Feeding    LATCH Score                   Interventions    Lactation Tools Discussed/Used     Consult Status      Tara [name], Tara [name] 07/15/2018, 5:36 AM

## 2018-07-15 NOTE — Progress Notes (Cosign Needed)
Post Partum Day 1 (Twin A), and Post-Partum Day 0 (Twin B)  Subjective: no complaints, voiding and tolerating PO  Objective: Blood pressure 124/85, pulse 77, temperature 98.2 F (36.8 C), temperature source Oral, resp. rate 18, height 5' 3.5" (1.613 m), weight 73.7 kg, last menstrual period 10/22/2017, SpO2 99 %.  Physical Exam:  General: alert and cooperative Lochia: appropriate Uterine Fundus: firm DVT Evaluation: No evidence of DVT seen on physical exam.  Recent Labs    07/13/18 0101 07/14/18 1404  HGB 12.1 12.0  HCT 35.0* 33.9*    Assessment/Plan: BTL today SW consult pending Rhogam indicated Plan for discharge tomorrow Breastfeeding   LOS: 1 day   Tara [name] 07/15/2018, 6:53 AM

## 2018-07-15 NOTE — Lactation Note (Addendum)
This note was copied from a baby's chart. Lactation Consultation Note  Patient Name: Tara [name]: 07/15/2018 Reason for consult: Initial assessment;Mother's request;1st time breastfeeding   Initial assessment with mom of 12 hour old Early Term Twins. Mom did not BF her older children and reports she does not remember making any milk. Mom reports her breasts got smaller with this pregnancy. Mom reports she would like to BF and not use formula if not necessary.   Reviewed with mom that infants need to be watched closely for weights, output and sleepiness at the breasts. Reviewed how to tell when yout infant is getting enough. Mom is very drowsy and falling asleep holding infant. Dad was falling asleep on the couch holding twin A. Infant's were assessed by NP and were swaddled and placed in crib. Reviewed with mom that if formula is started to BF first and then offer the formula.   Twin B was latched to the left breast in the football hold [facility]. Infant was feeding intermittently with a few swallows heard. Mom reports tenderness with feeding. Infant was removed from the breast, burped and then relatched to the left breast in the football hold. Infant fed intermittently for about 15 more minutes and then was swaddled and placed in crib. Enc mom to massage/compress breast with feeding and to stimulate infant as needed during feeding.   Twin A was placed on mom and attempted to help latch her to right breast in the cross cradle hold. Infant was not willing to latch. Infant was swaddled and placed in the crib.   Showed mom how to hand express, glistening of colostrum notes. Mom will most likely need to be shown again at a later time. Did not set up pump at this time as mom is exhausted and having difficulty staying awake. Reviewed with mom that pump is available if needed or if infants are not willing to feed at the breast.   BF Resources handout and [facility]  given, mom informed of IP/OP services, BF Support Groups and LC phone #. Mom has a pump at home, she is unsure of what brand.   Report to Tara Sawyershloe Winecoff, RN and Tara HumbleErin Campbell, NP.    Maternal Data Formula Feeding for Exclusion: Yes Reason for exclusion: Mother's choice to formula and breast feed on admission Has patient been taught Hand Expression?: Yes Does the patient have breastfeeding experience prior to this delivery?: No  Feeding Feeding Type: Breast Fed Length of feed: 0 min  LATCH Score Latch: Repeated attempts needed to sustain latch, nipple held in mouth throughout feeding, stimulation needed to elicit sucking reflex.  Audible Swallowing: A few with stimulation  Type of Nipple: Everted at rest and after stimulation  Comfort (Breast/Nipple): Filling, red/small blisters or bruises, mild/mod discomfort  Hold (Positioning): Assistance needed to correctly position infant at breast and maintain latch.  LATCH Score: 6  Interventions Interventions: Breast feeding basics reviewed;Support pillows;Assisted with latch;Position options;Skin to skin;Expressed milk;Breast massage;Breast compression;Hand express  Lactation Tools Discussed/Used WIC Program: No   Consult Status Consult Status: Follow-up [name]: 07/16/18 Follow-up type: In-patient    Tara [name] 07/15/2018, 12:55 PM

## 2018-07-15 NOTE — Anesthesia Postprocedure Evaluation (Signed)
Anesthesia Post Note  Patient: Tara [name]  Procedure(s) Performed: AN AD HOC LABOR EPIDURAL     Patient location during evaluation: Mother Baby Anesthesia Type: Epidural Level of consciousness: awake and alert Pain management: pain level controlled Vital Signs Assessment: post-procedure vital signs reviewed and stable Respiratory status: spontaneous breathing, nonlabored ventilation and respiratory function stable Cardiovascular status: stable Postop Assessment: no headache, no backache and epidural receding Anesthetic complications: no    Last Vitals:  Vitals:   07/15/18 0345 07/15/18 0500  BP: 124/89 124/85  Pulse: 86 77  Resp: 16 18  Temp: 36.7 C 36.8 C  SpO2:  99%    Last Pain:  Vitals:   07/15/18 0500  TempSrc: Oral  PainSc: 6    Pain Goal:                 EchoStarMERRITT,Jarret Torre

## 2018-07-15 NOTE — Progress Notes (Signed)
MD called per patient request for abdominal binder

## 2018-07-15 NOTE — Progress Notes (Signed)
CSW received consult for "twin delivery."  This is not a valid reason for a social work consult.  Please contact CSW if concerns arise or by MOB's request.

## 2018-07-16 ENCOUNTER — Encounter (HOSPITAL_COMMUNITY): Payer: Self-pay

## 2018-07-16 LAB — RH IG WORKUP (INCLUDES ABO/RH)
ABO/RH(D): O NEG
Fetal Screen: NEGATIVE
GESTATIONAL AGE(WKS): 38
Unit division: 0

## 2018-07-16 MED ORDER — KETOROLAC TROMETHAMINE 10 MG PO TABS: 10.0000 mg | ORAL_TABLET | Freq: Four times a day (QID) | ORAL | 0 refills | Status: DC | PRN

## 2018-07-16 MED ORDER — OXYCODONE HCL 5 MG PO TABS: 5.0000 mg | ORAL_TABLET | Freq: Four times a day (QID) | ORAL | 0 refills | Status: DC | PRN

## 2018-07-16 MED ORDER — KETOROLAC TROMETHAMINE 10 MG PO TABS
10.0000 mg | ORAL_TABLET | Freq: Four times a day (QID) | ORAL | Status: DC | PRN
  Administered 2018-07-16: 10 mg via ORAL
  Filled 2018-07-16 (×2): qty 1

## 2018-07-16 NOTE — Plan of Care (Signed)
Mom's pain control seems to be improved over last night.  Breast feeding is improving.

## 2018-07-16 NOTE — Lactation Note (Signed)
This note was copied from a baby's chart. Lactation Consultation Note  Patient Name: Tara [name] [id number]'s Date: 07/16/2018 Reason for consult: Follow-up assessment;Multiple gestation;Early term 37-38.6wks;1st time breastfeeding;Hyperbilirubinemia  Visited with P5 Mom of Early Term babies at 8636 hrs old.  Mom resting in bed, with baby A swaddled and latched to breast in football hold.  Baby noted to be latched onto nipple only.  Offered to help Mom re-latch baby deeper onto breast.  Reviewed breast massage and hand expression, not able to express a drop, but glistening noted on nipple tip. Unwrapped baby to provide STS. Baby easily latched deeper onto breast.  Showed parents how baby needs to latch onto areola with a wide gape of mouth.  Observed baby with a deep suck pattern, but unable to identify any swallowing. Mom desires to feed babies together.  Assisted with baby B to latch in football hold on left breast.  Baby latched easily and deeply.  Mom complaining of soreness, nipple not pinched post feeding.  RN notified of need for Coconut Oil.   Due to Mom's history of no breast changes, and no history of "milk coming in" with her previous 4 babies, encouraged Mom to double pump after babies nurse at the breast.  Plan- 1- Breastfeed babies at least every 3 hrs, skin to skin.  Ask for help to latch babies as needed. 2- offer both babies 10-20 ml formula+/EBM by paced bottle (reviewed how to pace bottle feed) 3- Pump both breasts on initiation setting, also doing breast massage and hand expression.  Save any EBM to feed back to baby. 4- Keep babies STS as much as possible.  RN aware of plan.  Plan written on dry erase board. Mom to call for assistance prn.  Interventions Interventions: Breast feeding basics reviewed;Assisted with latch;Skin to skin;Breast massage;Hand express;Breast compression;Adjust position;Support pillows;Position options;Coconut oil;Expressed milk;DEBP  Lactation  Tools Discussed/Used Tools: Pump;Bottle;Coconut oil Breast pump type: Double-Electric Breast Pump   Consult Status Consult Status: Follow-up Date: 07/17/18 Follow-up type: In-patient    Judee ClaraSmith, Gaddiel Cullens E 07/16/2018, 11:55 AM

## 2018-07-16 NOTE — Progress Notes (Signed)
POSTPARTUM PROGRESS NOTE  Postpartum Day 2 Subjective:  Tara [name] is a 32 y.o. [id number] 6318w1d s/p SVD of di-di twins. Pt denies problems with ambulating, voiding or po intake.  She denies nausea or vomiting, headache or vision changes.  Pain is moderately controlled (5 out of 10) with medications though she reports 10/10 total body pain without medications. Patient complains of lower leg edema and pruritic rash over arms and upper trunk.  Objective: Blood pressure 113/88, pulse 87, temperature 97.7 F (36.5 C), resp. rate 18, height 5' 3.5" (1.613 m), weight 73.7 kg, last menstrual period 10/22/2017, SpO2 99 %.  Physical Exam:  General: alert, cooperative and appears comfortable; objective assessment of pain discordant with subjective report  Lochia: normal flow, lightening Chest: no respiratory distress Heart: distal pulses intact Uterine Fundus: firm DVT Evaluation: bilateral lower leg and foot 2+ pitting edema; no redness Skin: Non-blanching erythematous macules on bilateral arms and upper chest  Recent Labs    07/14/18 1404  HGB 12.0  HCT 33.9*    Assessment/Plan:  ASSESSMENT: Tara [name] is a 32 y.o. [id number] 4518w1d s/p SVD of di-di twins.    - Breast and bottle feeding; mom feels milk hasn't fully come in yet. Lactation has been consulted.  -  Contraception: wants BTL at postpartum visit, plans for abstinence in the meantime. Provided counseling about changes of becoming pregnant and importance of condoms (or other forms of BC) if decides to have intercourse before tubal.  - Social work unwilling to consult on the basis of twins only; re-consult if needed for other reasons (e.g. Mood).   - Monitor rash  - Monitor bilateral lower extremity swelling; most likely normal postpartum swelling especially since no redness and not unilateral  - Discharge home today - Shift pain control regimen to Toradol, and oxycodone PRN for breakthrough    LOS: 2 days   Raynelle HighlandJuliana W  Stone MS3 07/16/2018, 7:43 AM   Midwife attestation Post Partum Day 2 I have seen and examined this patient and agree with above documentation in the student's note.   Tara [name] is a 32 y.o. [id number] s/p SVD.  Pt denies problems with ambulating, voiding or po intake. Pain is not controlled. Method of Feeding: breast and formula  PE:  Gen: well appearing Heart: reg rate Lungs: normal WOB Fundus firm Ext: soft, no pain, no edema Skin: diffuse rash to right forearm  Assessment: S/p SVD PPD #2 PUPPS  Plan for discharge: today Change to Toradol, Oxycodone prn  Donette LarryMelanie Bowie Doiron, CNM 2:36 PM

## 2018-07-16 NOTE — Progress Notes (Signed)
Encouraged mom not to sleep with baby.  Placed baby in crib

## 2018-07-16 NOTE — Discharge Summary (Signed)
OB Discharge Summary     Patient Name: Tara [name] DOB: 05/27/1986 MRN: [id number]  Date of admission: 07/14/2018 Delivering MD:    Jerline PainMcClendon, GirlA Kynlei [[id number]][[id number]]  Jari PiggSTINSON, JACOB J   Riebe, GirlB Sania [[id number]][[id number]]  Candelaria CelesteSTINSON, JACOB J   Date of discharge: 07/16/2018  Admitting diagnosis: 37.6WKS LEAKING FLUID Intrauterine pregnancy: 7368w1d     Secondary diagnosis:  Principal Problem:   Dichorionic diamniotic twin pregnancy Active Problems:   Supervision of high risk pregnancy, antepartum   Rh negative state in antepartum period   Twin gestation in third trimester  Additional problems: false positive RPR, ASCUS w/HR HPV     Discharge diagnosis: Term Pregnancy Delivered                                                                                                Post partum procedures:rhogam  Augmentation: none  Complications: None  Hospital course:  Onset of Labor With Vaginal Delivery     32 y.o. yo [id number] at 6868w1d was admitted in Active Labor on 07/14/2018. Patient had an uncomplicated labor course as follows:  Membrane Rupture Time/Date:    Jerline PainMcClendon, GirlA Aanya [[id number]][[id number]]  12:07 PM   Lorenza CambridgeMcClendon, GirlB Dafne [[id number]][[id number]]  11:53 PM ,   Jerline PainMcClendon, GirlA Lulie [[id number]][[id number]]  07/14/2018   Lorenza CambridgeMcClendon, GirlB Taylore [[id number]][[id number]]  07/14/2018   Intrapartum Procedures: Episiotomy:    Jerline PainMcClendon, GirlA Tashe [[id number]][[id number]]  None [1]   Lorenza CambridgeMcClendon, GirlB Izora [[id number]][[id number]]  None [1]                                         Lacerations:     Jerline PainMcClendon, GirlA Jaiana [[id number]][[id number]]  None [1]   Lorenza CambridgeMcClendon, GirlB Sabra [[id number]][[id number]]  None [1]  Patient had a delivery of a Viable infant.   Jerline PainMcClendon, GirlA Brittanee [[id number]][[id number]]  07/14/2018   Lorenza CambridgeMcClendon, GirlB Keziyah [[id number]][[id number]]  07/15/2018 Information for the patient's newborn:  Jerline PainMcClendon, GirlA Wilbert [[id number]][[id number]]  Delivery Method: Vaginal, Spontaneous(Filed from Delivery Summary) Information for the  patient's newborn:  Lorenza CambridgeMcClendon, GirlB Esmerelda [[id number]][[id number]]  Delivery Method: Vaginal, Spontaneous(Filed from Delivery Summary)    Pateint had an uncomplicated postpartum course.  She is ambulating, tolerating a regular diet, passing flatus, and urinating well. Patient is discharged home in stable condition on 07/16/18.   Physical exam  Vitals:   07/15/18 1238 07/15/18 1654 07/15/18 2320 07/16/18 0702  BP: 112/79 108/84 110/77 113/88  Pulse: 73 88 73 87  Resp: 18 18 16 18   Temp: (!) 97.5 F (36.4 C) 97.6 F (36.4 C) 98 F (36.7 C) 97.7 F (36.5 C)  TempSrc: Oral Oral Oral   SpO2:   99%   Weight:      Height:       General: alert, cooperative and no distress Lochia: appropriate Uterine Fundus: firm Incision: N/A DVT Evaluation: No evidence of DVT seen on physical exam. Negative Homan's sign. No cords or calf tenderness. No significant calf/ankle edema. Labs: Lab Results  Component Value Date   WBC 7.6  07/14/2018   HGB 12.0 07/14/2018   HCT 33.9 (L) 07/14/2018   MCV 97.1 07/14/2018   PLT 189 07/14/2018   CMP Latest Ref Rng & Units 07/13/2018  Glucose 70 - 99 mg/dL 84  BUN 6 - 20 mg/dL <0(A<5(L)  Creatinine 5.400.44 - 1.00 mg/dL 9.810.66  Sodium 191135 - 478145 mmol/L 137  Potassium 3.5 - 5.1 mmol/L 2.8(L)  Chloride 98 - 111 mmol/L 105  CO2 22 - 32 mmol/L 19(L)  Calcium 8.9 - 10.3 mg/dL 8.2(L)  Total Protein 6.5 - 8.1 g/dL 2.9(F5.7(L)  Total Bilirubin 0.3 - 1.2 mg/dL 1.0  Alkaline Phos 38 - 126 U/L 242(H)  AST 15 - 41 U/L 73(H)  ALT 0 - 44 U/L 40    Discharge instruction: per After Visit Summary and "Baby and Me Booklet".  After visit meds:  Allergies as of 07/16/2018   No Known Allergies     Medication List    STOP taking these medications   aspirin EC 81 MG tablet   hydrOXYzine 25 MG capsule Commonly known as:  VISTARIL   ranitidine 150 MG capsule Commonly known as:  ZANTAC   zolpidem 5 MG tablet Commonly known as:  AMBIEN     TAKE these medications   acetaminophen  325 MG tablet Commonly known as:  TYLENOL Take 650 mg by mouth every 6 (six) hours as needed.   ketorolac 10 MG tablet Commonly known as:  TORADOL Take 1 tablet (10 mg total) by mouth every 6 (six) hours as needed for moderate pain.   oxyCODONE 5 MG immediate release tablet Commonly known as:  Oxy IR/ROXICODONE Take 1 tablet (5 mg total) by mouth every 6 (six) hours as needed for moderate pain (for pain of a 1-6).   Prenatal Vitamins 0.8 MG tablet Take 1 tablet by mouth daily.       Diet: routine diet  Activity: Advance as tolerated. Pelvic rest for 6 weeks.   Outpatient follow up:2 weeks for interval BTL Follow up Appt:No future appointments. Follow up Visit:No follow-ups on file.  Postpartum contraception: interval BTL  Newborn Data:   Jerline PainMcClendon, GirlA Eliane [[id number]][[id number]]  Live born female  Birth Weight: 6 lb 10 oz (3005 g) APGAR: 9, 9  Newborn Delivery   Birth date/time:  07/14/2018 23:45:00 Delivery type:  Vaginal, Spontaneous      Lorenza CambridgeMcClendon, GirlB Sammy [[id number]][[id number]]  Live born female  Birth Weight: 7 lb 2.3 oz (3240 g) APGAR: 9, 9  Newborn Delivery   Birth date/time:  07/15/2018 00:03:00 Delivery type:  Vaginal, Spontaneous     Baby Feeding: Bottle and Breast Disposition:rooming in   07/16/2018 Donette LarryMelanie Raven Harmes, CNM

## 2018-07-17 ENCOUNTER — Ambulatory Visit: Payer: Self-pay

## 2018-07-17 NOTE — Lactation Note (Signed)
This note was copied from a baby's chart. Lactation Consultation Note  Patient Name: Tara [name] Tara [name] ZOXWR'UToday's Date: 07/17/2018 Reason for consult: Follow-up assessment;Multiple gestation;Early term 37-38.6wks As LC entered room , mom getting ready to feed baby a bottle.  Per mom haven't breast fed much or pumped much in the last 24 hours, didn't feel up to it.  Have a DEBP - Ameda at home and plan to get pumping.  Sore nipple and engorgement prevention and tx reviewed.  LC explored feeding options with mom an the importance if the babies are not going to the Breast 1st to and solely receiving a bottle for a feeding need to increase the volume on both babies due to their age. ( 58 hours old) and to feed by feeding cues and by  3 hours due to being early)   term.  Another feeding  option one feeding feed one twin at the breast and the other gets a bottle.  Next feeding switch and feed  the other at the breast and the other bottle.  LC reviewed supply and demand and the importance of consistent stimulation to the breast to establish her milk supply well.  If not feeding the baby at the breast - need to pump at least 8-10 x's a day for 15 -20 mins both breast at a time.  Mother informed of post-discharge support and given phone number to the lactation department, including services for phone call assistance; out-patient appointments; and breastfeeding support group. List of other breastfeeding resources in the community given in the handout. Encouraged mother to call for problems or concerns related to breastfeeding.  Se doc flow sheets  For baby A and Baby B for details - did not breast feed at this consult - parents prefer bottles.      Maternal Data    Feeding Feeding Type: Formula Nipple Type: Slow - flow  LATCH Score                   Interventions Interventions: Breast feeding basics reviewed  Lactation Tools Discussed/Used     Consult Status Consult  Status: Complete Date: 07/17/18    Kathrin GreathouseMargaret Ann Christophe Rising 07/17/2018, 11:29 AM

## 2018-07-18 LAB — TYPE AND SCREEN
ABO/RH(D): O NEG
ANTIBODY SCREEN: POSITIVE
UNIT DIVISION: 0
Unit division: 0

## 2018-07-18 LAB — BPAM RBC
BLOOD PRODUCT EXPIRATION DATE: 201909162359
BLOOD PRODUCT EXPIRATION DATE: 201909162359
Unit Type and Rh: 9500
Unit Type and Rh: 9500

## 2018-07-31 ENCOUNTER — Other Ambulatory Visit: Payer: Self-pay | Admitting: Family Medicine

## 2018-07-31 DIAGNOSIS — K219 Gastro-esophageal reflux disease without esophagitis: Secondary | ICD-10-CM

## 2018-08-14 ENCOUNTER — Ambulatory Visit: Payer: Managed Care, Other (non HMO) | Admitting: Obstetrics and Gynecology

## 2018-08-18 ENCOUNTER — Encounter: Payer: Self-pay | Admitting: Family Medicine

## 2018-08-18 ENCOUNTER — Encounter: Payer: Self-pay | Admitting: Radiology

## 2018-08-18 ENCOUNTER — Ambulatory Visit (INDEPENDENT_AMBULATORY_CARE_PROVIDER_SITE_OTHER): Payer: Managed Care, Other (non HMO) | Admitting: Family Medicine

## 2018-08-18 VITALS — BP 127/85 | HR 72

## 2018-08-18 DIAGNOSIS — Z1389 Encounter for screening for other disorder: Secondary | ICD-10-CM

## 2018-08-18 DIAGNOSIS — Z30015 Encounter for initial prescription of vaginal ring hormonal contraceptive: Secondary | ICD-10-CM

## 2018-08-18 MED ORDER — ETONOGESTREL-ETHINYL ESTRADIOL 0.12-0.015 MG/24HR VA RING: VAGINAL_RING | VAGINAL | 12 refills | Status: DC

## 2018-08-18 NOTE — Progress Notes (Signed)
Post Partum Exam  Tara [name] is a 32 y.o. 975P4004 female who presents for a postpartum visit. She is six weeks postpartum following a  Vaginal delivery:07/15/2018. I have fully reviewed the prenatal and intrapartum course. The delivery was at 40 gestational weeks.  Anesthesia: Epidual. Postpartum course has been uncomplicated. Baby's course has been uncomplicated. Baby is feeding by bottle. Bleeding not at this time. Bowel function is normal. Bladder function is normal. Patient is not sexually active. Contraception method is nothing at this time but desires pill. Postpartum depression screening:negative   Last pap smear done 12/29/2017-normal   Review of Systems Pertinent items noted in HPI and remainder of comprehensive ROS otherwise negative.    Objective:  Blood pressure 127/85, pulse 72, unknown if currently breastfeeding.  General:  alert, cooperative and appears stated age  Lungs: normal effort  Heart:  regular rate and rhythm  Abdomen: soft, non-tender; bowel sounds normal; no masses,  no organomegaly        Assessment:    Nml postpartum exam. Pap smear not done at today's visit.   Plan:   1. Contraception: NuvaRing vaginal inserts and and sample of OC's given in case she doesn't like the ring.  2. For BTL--has private insurance-->will book for bilateral salpingectomy Patient counseled, r.e. Risks benefits of BTL, including permanency of procedure, risk of failure(1:100), though much less with salpingectomy, increased risk of ectopic.  Patient verbalized understanding and desires to proceed  3. Follow up in: 4 months for CPE or as needed.

## 2018-08-19 ENCOUNTER — Encounter (HOSPITAL_COMMUNITY): Payer: Self-pay

## 2018-09-08 NOTE — H&P (Signed)
  Tara [name] is an 32 y.o. [phone] female.   Chief Complaint: Undesired fertility HPI: desires permanent sterilization  Past Medical History:  Diagnosis Date  . Menorrhagia with regular cycle 12/17/2016  . Vaginal Pap smear, abnormal     Past Surgical History:  Procedure Laterality Date  . COLPOSCOPY      Family History  Problem Relation Age of Onset  . Hypertension Mother   . Arthritis Maternal Grandmother   . Alcohol abuse Neg Hx   . Asthma Neg Hx   . Birth defects Neg Hx   . Cancer Neg Hx   . COPD Neg Hx   . Diabetes Neg Hx   . Depression Neg Hx   . Drug abuse Neg Hx   . Early death Neg Hx   . Hearing loss Neg Hx   . Heart disease Neg Hx   . Hyperlipidemia Neg Hx   . Kidney disease Neg Hx   . Learning disabilities Neg Hx   . Mental illness Neg Hx   . Mental retardation Neg Hx   . Miscarriages / Stillbirths Neg Hx   . Stroke Neg Hx   . Vision loss Neg Hx   . Varicose Veins Neg Hx    Social History:  reports that she has never smoked. She has never used smokeless tobacco. She reports that she does not drink alcohol or use drugs.  Allergies: No Known Allergies  No medications prior to admission.    A comprehensive review of systems was negative.  unknown if currently breastfeeding. General appearance: alert, cooperative and appears stated age Head: Normocephalic, without obvious abnormality, atraumatic Neck: supple, symmetrical, trachea midline Lungs: normal effort Heart: regular rate and rhythm Abdomen: soft, non-tender; bowel sounds normal; no masses,  no organomegaly Extremities: extremities normal, atraumatic, no cyanosis or edema and Homans sign is negative, no sign of DVT Skin: Skin color, texture, turgor normal. No rashes or lesions Neurologic: Grossly normal   Lab Results  Component Value Date   WBC 7.6 07/14/2018   HGB 12.0 07/14/2018   HCT 33.9 (L) 07/14/2018   MCV 97.1 07/14/2018   PLT 189 07/14/2018   No results found for:  PREGTESTUR, PREGSERUM, HCG, HCGQUANT   Assessment/Plan Principal Problem:   Encounter for sterilization  For laparoscopic bilateral salpingectomy. Patient counseled, r.e. Risks benefits of BTL, including permanency of procedure and decreased risk of ovarian cancer with tube removal  Patient verbalized understanding and desires to proceed  Risks include but are not limited to bleeding, infection, injury to surrounding structures, including bowel, bladder and ureters, blood clots, and death.  Likelihood of success is high.    Reva Bores 09/08/2018, 10:39 AM

## 2018-09-14 ENCOUNTER — Encounter (HOSPITAL_BASED_OUTPATIENT_CLINIC_OR_DEPARTMENT_OTHER): Payer: Self-pay | Admitting: *Deleted

## 2018-09-14 ENCOUNTER — Other Ambulatory Visit: Payer: Self-pay

## 2018-09-14 NOTE — Progress Notes (Signed)
Spoke with Siobahn Npo after midnight arrive 09-18-18 [facility]  Pre op lab appointment for cbc 09-16-18 900 am Driver [place] spouse Needs urine poct

## 2018-09-16 ENCOUNTER — Other Ambulatory Visit (HOSPITAL_COMMUNITY): Payer: Managed Care, Other (non HMO)

## 2018-09-16 ENCOUNTER — Encounter (HOSPITAL_COMMUNITY)
Admission: RE | Admit: 2018-09-16 | Discharge: 2018-09-16 | Disposition: A | Discharge status: Home / Self Care | Payer: Managed Care, Other (non HMO) | Source: Ambulatory Visit | Attending: Family Medicine | Admitting: Family Medicine

## 2018-09-16 LAB — CBC
HCT: 38.4 % (ref 36.0–46.0)
Hemoglobin: 12.1 g/dL (ref 12.0–15.0)
MCH: 31.1 pg (ref 26.0–34.0)
MCHC: 31.5 g/dL (ref 30.0–36.0)
MCV: 98.7 fL (ref 80.0–100.0)
Platelets: 424 10*3/uL — ABNORMAL HIGH (ref 150–400)
RBC: 3.89 MIL/uL (ref 3.87–5.11)
RDW: 11.8 % (ref 11.5–15.5)
WBC: 3.6 10*3/uL — ABNORMAL LOW (ref 4.0–10.5)
nRBC: 0 % (ref 0.0–0.2)

## 2018-09-17 ENCOUNTER — Encounter (HOSPITAL_BASED_OUTPATIENT_CLINIC_OR_DEPARTMENT_OTHER): Payer: Self-pay | Admitting: Anesthesiology

## 2018-09-17 NOTE — Anesthesia Preprocedure Evaluation (Addendum)
Anesthesia Evaluation  Patient identified by MRN, date of birth, ID band Patient awake    Reviewed: Allergy & Precautions, NPO status , Patient's Chart, lab work & pertinent test results  Airway Mallampati: II       Dental no notable dental hx. (+) Teeth Intact   Pulmonary neg pulmonary ROS,    Pulmonary exam normal breath sounds clear to auscultation       Cardiovascular negative cardio ROS Normal cardiovascular exam Rhythm:Regular Rate:Normal     Neuro/Psych negative neurological ROS  negative psych ROS   GI/Hepatic negative GI ROS, Neg liver ROS,   Endo/Other  negative endocrine ROS  Renal/GU negative Renal ROS  negative genitourinary   Musculoskeletal negative musculoskeletal ROS (+)   Abdominal Normal abdominal exam  (+)   Peds  Hematology negative hematology ROS (+)   Anesthesia Other Findings   Reproductive/Obstetrics                            Anesthesia Physical Anesthesia Plan  ASA: I  Anesthesia Plan: General   Post-op Pain Management:    Induction: Intravenous  PONV Risk Score and Plan: Ondansetron, Dexamethasone, Scopolamine patch - Pre-op and Midazolam  Airway Management Planned: Oral ETT  Additional Equipment:   Intra-op Plan:   Post-operative Plan: Extubation in OR  Informed Consent: I have reviewed the patients History and Physical, chart, labs and discussed the procedure including the risks, benefits and alternatives for the proposed anesthesia with the patient or authorized representative who has indicated his/her understanding and acceptance.   Dental advisory given  Plan Discussed with: CRNA and Surgeon  Anesthesia Plan Comments:        Anesthesia Quick Evaluation

## 2018-09-18 ENCOUNTER — Encounter (HOSPITAL_BASED_OUTPATIENT_CLINIC_OR_DEPARTMENT_OTHER)
Admission: RE | Disposition: A | Discharge status: Home / Self Care | Payer: Self-pay | Source: Other Acute Inpatient Hospital | Attending: Family Medicine

## 2018-09-18 ENCOUNTER — Ambulatory Visit (HOSPITAL_BASED_OUTPATIENT_CLINIC_OR_DEPARTMENT_OTHER)
Admission: RE | Admit: 2018-09-18 | Discharge: 2018-09-18 | Disposition: A | Discharge status: Home / Self Care | Payer: Managed Care, Other (non HMO) | Source: Other Acute Inpatient Hospital | Attending: Family Medicine | Admitting: Family Medicine

## 2018-09-18 ENCOUNTER — Other Ambulatory Visit: Payer: Self-pay

## 2018-09-18 ENCOUNTER — Ambulatory Visit (HOSPITAL_BASED_OUTPATIENT_CLINIC_OR_DEPARTMENT_OTHER): Payer: Managed Care, Other (non HMO) | Admitting: Anesthesiology

## 2018-09-18 ENCOUNTER — Encounter (HOSPITAL_BASED_OUTPATIENT_CLINIC_OR_DEPARTMENT_OTHER): Payer: Self-pay | Admitting: *Deleted

## 2018-09-18 DIAGNOSIS — N920 Excessive and frequent menstruation with regular cycle: Secondary | ICD-10-CM | POA: Diagnosis not present

## 2018-09-18 DIAGNOSIS — Z302 Encounter for sterilization: Secondary | ICD-10-CM | POA: Diagnosis present

## 2018-09-18 HISTORY — PX: LAPAROSCOPIC BILATERAL SALPINGECTOMY: SHX5889

## 2018-09-18 LAB — POCT PREGNANCY, URINE: PREG TEST UR: NEGATIVE

## 2018-09-18 SURGERY — SALPINGECTOMY, BILATERAL, LAPAROSCOPIC
Anesthesia: General | Site: Abdomen | Laterality: Bilateral

## 2018-09-18 MED ORDER — BUPIVACAINE HCL (PF) 0.25 % IJ SOLN
INTRAMUSCULAR | Status: DC | PRN
  Administered 2018-09-18: 8 mL

## 2018-09-18 MED ORDER — OXYCODONE HCL 5 MG PO TABS
5.0000 mg | ORAL_TABLET | Freq: Once | ORAL | Status: AC | PRN
  Administered 2018-09-18: 5 mg via ORAL
  Filled 2018-09-18: qty 1

## 2018-09-18 MED ORDER — PROPOFOL 10 MG/ML IV BOLUS
INTRAVENOUS | Status: DC | PRN
  Administered 2018-09-18: 180 mg via INTRAVENOUS

## 2018-09-18 MED ORDER — MIDAZOLAM HCL 5 MG/5ML IJ SOLN
INTRAMUSCULAR | Status: DC | PRN
  Administered 2018-09-18: 2 mg via INTRAVENOUS

## 2018-09-18 MED ORDER — FENTANYL CITRATE (PF) 100 MCG/2ML IJ SOLN
INTRAMUSCULAR | Status: AC
  Filled 2018-09-18: qty 2

## 2018-09-18 MED ORDER — KETOROLAC TROMETHAMINE 30 MG/ML IJ SOLN
INTRAMUSCULAR | Status: DC | PRN
  Administered 2018-09-18: 30 mg via INTRAVENOUS

## 2018-09-18 MED ORDER — PROPOFOL 10 MG/ML IV BOLUS
INTRAVENOUS | Status: AC
  Filled 2018-09-18: qty 20

## 2018-09-18 MED ORDER — SUGAMMADEX SODIUM 200 MG/2ML IV SOLN
INTRAVENOUS | Status: AC
  Filled 2018-09-18: qty 2

## 2018-09-18 MED ORDER — ROCURONIUM BROMIDE 100 MG/10ML IV SOLN
INTRAVENOUS | Status: DC | PRN
  Administered 2018-09-18: 40 mg via INTRAVENOUS

## 2018-09-18 MED ORDER — LIDOCAINE HCL (CARDIAC) PF 100 MG/5ML IV SOSY
PREFILLED_SYRINGE | INTRAVENOUS | Status: DC | PRN
  Administered 2018-09-18: 80 mg via INTRAVENOUS

## 2018-09-18 MED ORDER — ONDANSETRON HCL 4 MG/2ML IJ SOLN
INTRAMUSCULAR | Status: AC
  Filled 2018-09-18: qty 2

## 2018-09-18 MED ORDER — LIDOCAINE 2% (20 MG/ML) 5 ML SYRINGE
INTRAMUSCULAR | Status: AC
  Filled 2018-09-18: qty 5

## 2018-09-18 MED ORDER — DEXAMETHASONE SODIUM PHOSPHATE 4 MG/ML IJ SOLN
INTRAMUSCULAR | Status: DC | PRN
  Administered 2018-09-18: 10 mg via INTRAVENOUS

## 2018-09-18 MED ORDER — KETOROLAC TROMETHAMINE 30 MG/ML IJ SOLN
30.0000 mg | Freq: Once | INTRAMUSCULAR | Status: DC | PRN
  Filled 2018-09-18: qty 1

## 2018-09-18 MED ORDER — ACETAMINOPHEN 160 MG/5ML PO SOLN
325.0000 mg | ORAL | Status: DC | PRN
  Filled 2018-09-18: qty 20.3

## 2018-09-18 MED ORDER — FENTANYL CITRATE (PF) 100 MCG/2ML IJ SOLN
25.0000 ug | INTRAMUSCULAR | Status: DC | PRN
  Administered 2018-09-18: 50 ug via INTRAVENOUS
  Filled 2018-09-18: qty 1

## 2018-09-18 MED ORDER — ACETAMINOPHEN 325 MG PO TABS
325.0000 mg | ORAL_TABLET | ORAL | Status: DC | PRN
  Filled 2018-09-18: qty 2

## 2018-09-18 MED ORDER — OXYCODONE HCL 5 MG PO TABS
ORAL_TABLET | ORAL | Status: AC
  Filled 2018-09-18: qty 1

## 2018-09-18 MED ORDER — MEPERIDINE HCL 25 MG/ML IJ SOLN
6.2500 mg | INTRAMUSCULAR | Status: DC | PRN
  Filled 2018-09-18: qty 1

## 2018-09-18 MED ORDER — SUGAMMADEX SODIUM 200 MG/2ML IV SOLN
INTRAVENOUS | Status: DC | PRN
  Administered 2018-09-18: 200 mg via INTRAVENOUS

## 2018-09-18 MED ORDER — OXYCODONE-ACETAMINOPHEN 5-325 MG PO TABS: 1.0000 | ORAL_TABLET | Freq: Four times a day (QID) | ORAL | 0 refills | Status: DC | PRN

## 2018-09-18 MED ORDER — DEXAMETHASONE SODIUM PHOSPHATE 10 MG/ML IJ SOLN
INTRAMUSCULAR | Status: AC
  Filled 2018-09-18: qty 1

## 2018-09-18 MED ORDER — LACTATED RINGERS IV SOLN
INTRAVENOUS | Status: DC
  Administered 2018-09-18 (×2): via INTRAVENOUS
  Filled 2018-09-18: qty 1000

## 2018-09-18 MED ORDER — ONDANSETRON HCL 4 MG/2ML IJ SOLN
INTRAMUSCULAR | Status: DC | PRN
  Administered 2018-09-18: 4 mg via INTRAVENOUS

## 2018-09-18 MED ORDER — OXYCODONE HCL 5 MG/5ML PO SOLN
5.0000 mg | Freq: Once | ORAL | Status: AC | PRN
  Filled 2018-09-18: qty 5

## 2018-09-18 MED ORDER — FENTANYL CITRATE (PF) 100 MCG/2ML IJ SOLN
INTRAMUSCULAR | Status: DC | PRN
  Administered 2018-09-18: 100 ug via INTRAVENOUS

## 2018-09-18 MED ORDER — IBUPROFEN 600 MG PO TABS: 600.0000 mg | ORAL_TABLET | Freq: Four times a day (QID) | ORAL | 1 refills | Status: DC | PRN

## 2018-09-18 MED ORDER — ROCURONIUM BROMIDE 10 MG/ML (PF) SYRINGE
PREFILLED_SYRINGE | INTRAVENOUS | Status: AC
  Filled 2018-09-18: qty 10

## 2018-09-18 MED ORDER — LACTATED RINGERS IV SOLN
INTRAVENOUS | Status: DC
  Administered 2018-09-18: 06:00:00 via INTRAVENOUS
  Filled 2018-09-18: qty 1000

## 2018-09-18 MED ORDER — ONDANSETRON HCL 4 MG/2ML IJ SOLN
4.0000 mg | Freq: Once | INTRAMUSCULAR | Status: DC | PRN
  Filled 2018-09-18: qty 2

## 2018-09-18 MED ORDER — MIDAZOLAM HCL 2 MG/2ML IJ SOLN
INTRAMUSCULAR | Status: AC
  Filled 2018-09-18: qty 2

## 2018-09-18 SURGICAL SUPPLY — 34 items
CABLE HIGH FREQUENCY MONO STRZ (ELECTRODE) IMPLANT
CATH ROBINSON RED A/P 16FR (CATHETERS) IMPLANT
COVER SURGICAL LIGHT HANDLE (MISCELLANEOUS) ×3 IMPLANT
DERMABOND ADVANCED (GAUZE/BANDAGES/DRESSINGS) ×2
DERMABOND ADVANCED .7 DNX12 (GAUZE/BANDAGES/DRESSINGS) ×1 IMPLANT
DRSG OPSITE POSTOP 3X4 (GAUZE/BANDAGES/DRESSINGS) IMPLANT
DURAPREP 26ML APPLICATOR (WOUND CARE) ×3 IMPLANT
GLOVE BIO SURGEON STRL SZ 6.5 (GLOVE) ×2 IMPLANT
GLOVE BIO SURGEONS STRL SZ 6.5 (GLOVE) ×1
GLOVE BIOGEL PI IND STRL 6.5 (GLOVE) ×1 IMPLANT
GLOVE BIOGEL PI IND STRL 7.0 (GLOVE) ×4 IMPLANT
GLOVE BIOGEL PI IND STRL 7.5 (GLOVE) ×1 IMPLANT
GLOVE BIOGEL PI INDICATOR 6.5 (GLOVE) ×2
GLOVE BIOGEL PI INDICATOR 7.0 (GLOVE) ×8
GLOVE BIOGEL PI INDICATOR 7.5 (GLOVE) ×2
GLOVE ECLIPSE 7.0 STRL STRAW (GLOVE) ×3 IMPLANT
GOWN STRL REUS W/TWL LRG LVL3 (GOWN DISPOSABLE) ×6 IMPLANT
NS IRRIG 1000ML POUR BTL (IV SOLUTION) ×3 IMPLANT
PACK LAPAROSCOPY BASIN (CUSTOM PROCEDURE TRAY) ×3 IMPLANT
PACK TRENDGUARD 450 HYBRID PRO (MISCELLANEOUS) ×1 IMPLANT
POUCH SPECIMEN RETRIEVAL 10MM (ENDOMECHANICALS) IMPLANT
PROTECTOR NERVE ULNAR (MISCELLANEOUS) IMPLANT
SET IRRIG TUBING LAPAROSCOPIC (IRRIGATION / IRRIGATOR) IMPLANT
SHEARS HARMONIC ACE PLUS 36CM (ENDOMECHANICALS) ×3 IMPLANT
SLEEVE XCEL OPT CAN 5 100 (ENDOMECHANICALS) ×27 IMPLANT
SUT VIC AB 3-0 X1 27 (SUTURE) ×3 IMPLANT
SUT VICRYL 0 UR6 27IN ABS (SUTURE) ×6 IMPLANT
TOWEL OR 17X24 6PK STRL BLUE (TOWEL DISPOSABLE) ×6 IMPLANT
TRAY FOLEY W/BAG SLVR 14FR (SET/KITS/TRAYS/PACK) ×3 IMPLANT
TRENDGUARD 450 HYBRID PRO PACK (MISCELLANEOUS) ×3
TROCAR BALLN 12MMX100 BLUNT (TROCAR) IMPLANT
TROCAR XCEL NON-BLD 5MMX100MML (ENDOMECHANICALS) ×6 IMPLANT
TUBING INSUF HEATED (TUBING) ×3 IMPLANT
WARMER LAPAROSCOPE (MISCELLANEOUS) ×3 IMPLANT

## 2018-09-18 NOTE — Discharge Instructions (Signed)
Laparoscopy, Care After Refer to this sheet in the next few weeks. These instructions provide you with information about caring for yourself after your procedure. Your health care provider may also give you more specific instructions. Your treatment has been planned according to current medical practices, but problems sometimes occur. Call your health care provider if you have any problems or questions after your procedure. What can I expect after the procedure? After your procedure, it is common to have mild discomfort in the throat and abdomen. Follow these instructions at home:  Take over-the-counter and prescription medicines only as told by your health care provider.  Do not drive for 24 hours if you received a sedative.  Return to your normal activities as told by your health care provider.  Do not take baths, swim, or use a hot tub until your health care provider approves. You may shower.  Follow instructions from your health care provider about how to take care of your incision. Make sure you: ? Wash your hands with soap and water before you change your bandage (dressing). If soap and water are not available, use hand sanitizer. ? Change your dressing as told by your health care provider. ? Leave stitches (sutures), skin glue, or adhesive strips in place. These skin closures may need to stay in place for 2 weeks or longer. If adhesive strip edges start to loosen and curl up, you may trim the loose edges. Do not remove adhesive strips completely unless your health care provider tells you to do that.  Check your incision area every day for signs of infection. Check for: ? More redness, swelling, or pain. ? More fluid or blood. ? Warmth. ? Pus or a bad smell.  It is your responsibility to get the results of your procedure. Ask your health care provider or the department performing the procedure when your results will be ready. Contact a health care provider if:  There is new pain in  your shoulders.  You feel light-headed or faint.  You are unable to pass gas or unable to have a bowel movement.  You feel nauseous or you vomit.  You develop a rash.  You have more redness, swelling, or pain around your incision.  You have more fluid or blood coming from your incision.  Your incision feels warm to the touch.  You have pus or a bad smell coming from your incision.  You have a fever or chills. Get help right away if:  Your pain is getting worse.  You have ongoing vomiting.  The edges of your incision open up.  You have trouble breathing.  You have chest pain. This information is not intended to replace advice given to you by your health care provider. Make sure you discuss any questions you have with your health care provider. Document Released: 10/30/2015 Document Revised: 04/25/2016 Document Reviewed: 08/01/2015 Elsevier Interactive Patient Education  2018 [employer].    Post Anesthesia Home Care Instructions  Activity: Get plenty of rest for the remainder of the day. A responsible individual must stay with you for 24 hours following the procedure.  For the next 24 hours, DO NOT: -Drive a car -Advertising copywriter -Drink alcoholic beverages -Take any medication unless instructed by your physician -Make any legal decisions or sign important papers.  Meals: Start with liquid foods such as gelatin or soup. Progress to regular foods as tolerated. Avoid greasy, spicy, heavy foods. If nausea and/or vomiting occur, drink only clear liquids until the nausea and/or vomiting  subsides. Call your physician if vomiting continues.  Special Instructions/Symptoms: Your throat may feel dry or sore from the anesthesia or the breathing tube placed in your throat during surgery. If this causes discomfort, gargle with warm salt water. The discomfort should disappear within 24 hours.

## 2018-09-18 NOTE — Transfer of Care (Signed)
Immediate Anesthesia Transfer of Care Note  Patient: Tara [name]  Procedure(s) Performed: Procedure(s) (LRB): LAPAROSCOPIC BILATERAL SALPINGECTOMY (Bilateral)  Patient Location: PACU  Anesthesia Type: General  Level of Consciousness: awake, sedated, patient cooperative and responds to stimulation  Airway & Oxygen Therapy: Patient Spontanous Breathing and Patient connected to [place] oxygen  Post-op Assessment: Report given to PACU RN, Post -op Vital signs reviewed and stable and Patient moving all extremities  Post vital signs: Reviewed and stable  Complications: No apparent anesthesia complications

## 2018-09-18 NOTE — Anesthesia Procedure Notes (Signed)
Procedure Name: Intubation Date/Time: 09/18/2018 7:36 AM Performed by: Justice Rocher, CRNA Pre-anesthesia Checklist: Patient identified, Emergency Drugs available, Suction available, Patient being monitored and Timeout performed Patient Re-evaluated:Patient Re-evaluated prior to induction Oxygen Delivery Method: Circle system utilized Preoxygenation: Pre-oxygenation with 100% oxygen Induction Type: IV induction Ventilation: Mask ventilation without difficulty Laryngoscope Size: Mac and 3 Grade View: Grade I Tube type: Oral Tube size: 7.0 mm Number of attempts: 1 Airway Equipment and Method: Stylet and Oral airway Placement Confirmation: ETT inserted through vocal cords under direct vision,  positive ETCO2 and breath sounds checked- equal and bilateral Secured at: 21 cm Tube secured with: Tape Dental Injury: Teeth and Oropharynx as per pre-operative assessment

## 2018-09-18 NOTE — Op Note (Signed)
PROCEDURE DATE: 09/18/2018  PREOPERATIVE DIAGNOSES: Undesired fertility  POSTOPERATIVE DIAGNOSES: The same   PROCEDURE: Laparoscopic bilateral salpingctomy   SURGEON: Dr. Reva Bores   ASSISTANT: None  ANESTHESIOLOGIST: Leilani Able, MD MD - GETT  INDICATIONS: 32 y.o. [id number] who desired permanent sterilization.  FINDINGS: Normal appearing uterus, tubes and ovaries, possible pelvic congestion syndrome   ESTIMATED BLOOD LOSS: 25 ml   SPECIMENS: Bilateral fallopian tubes to pathology  COMPLICATIONS: None immediately known   PROCEDURE IN DETAIL: The patient had sequential compression devices applied to her lower extremities while in the preoperative area. She was then taken to the operating room where general anesthesia was administered and was found to be adequate. She was placed in the dorsal lithotomy position, and was prepped and draped in a sterile manner. A Foleycatheter was inserted into her bladder. A speculum was placed inside the vagina, and the cervix grasped with an single tooth tenaculum. A Hulka tenaculum was placed through the cervix for uterine manipulation. Attention was then turned to the patient's abdomen where a 11-mm skin incision was made in the umbilicus.  This was carried down to the underlying fascia and peritoneum.  The fascia was tagged with 0 Vicryl suture on a UR-6. Intraperitoneal placement was confirmed and insufflation done. A survey of the patient's pelvis and abdomen revealed the findings above. Two left sided 5-mm lower quadrant ports were then placed under direct visualization. On the right side, the tube was separated from the mesosalpinx with the Harmonic device.  On the left side, the tube was separated from the mesosalpinx with the Harmonic device.  The specimen was then removed from the abdomen through the 11-mm port, under direct visualization. The operative site was surveyed, and found to be hemostatic. No intraoperative injury to other  surrounding organs was noted. The abdomen was desufflated and all instruments were then removed from the patient's abdomen. Fascial closure was completed with aforementioned 0 Vicryl on a UR-6. All skin incisions were closed with 3-0 Vicryl subcuticular stitches and Dermabond.   Reva Bores MD 09/18/2018 8:16 AM

## 2018-09-18 NOTE — Interval H&P Note (Signed)
History and Physical Interval Note:  09/18/2018 7:14 AM  Tara [name]  has presented today for surgery, with the diagnosis of Undesired Fertility  The various methods of treatment have been discussed with the patient and family. After consideration of risks, benefits and other options for treatment, the patient has consented to  Procedure(s): LAPAROSCOPIC BILATERAL SALPINGECTOMY (Bilateral) as a surgical intervention .  The patient's history has been reviewed, patient examined, no change in status, stable for surgery.  I have reviewed the patient's chart and labs.  Questions were answered to the patient's satisfaction.     Reva Bores

## 2018-09-21 ENCOUNTER — Encounter (HOSPITAL_BASED_OUTPATIENT_CLINIC_OR_DEPARTMENT_OTHER): Payer: Self-pay | Admitting: Family Medicine

## 2018-09-23 NOTE — Anesthesia Postprocedure Evaluation (Signed)
Anesthesia Post Note  Patient: [name]  Procedure(s) Performed: LAPAROSCOPIC BILATERAL SALPINGECTOMY (Bilateral Abdomen)     Patient location during evaluation: PACU Anesthesia Type: General Level of consciousness: awake Pain management: pain level controlled Vital Signs Assessment: post-procedure vital signs reviewed and stable Respiratory status: spontaneous breathing Cardiovascular status: stable Postop Assessment: no apparent nausea or vomiting Anesthetic complications: no    Last Vitals:  Vitals:   09/18/18 0915 09/18/18 1023  BP: 126/71 122/72  Pulse: 62 74  Resp: 15 15  Temp:  36.8 C  SpO2: 100% 100%    Last Pain:  Vitals:   09/18/18 0931  TempSrc:   PainSc: 5    Pain Goal: Patients Stated Pain Goal: 8 (09/18/18 0558)               Tara [name]

## 2018-10-13 ENCOUNTER — Encounter: Payer: Managed Care, Other (non HMO) | Admitting: Family Medicine

## 2018-10-20 ENCOUNTER — Encounter: Payer: Self-pay | Admitting: Family Medicine

## 2018-10-20 ENCOUNTER — Ambulatory Visit (INDEPENDENT_AMBULATORY_CARE_PROVIDER_SITE_OTHER): Payer: Managed Care, Other (non HMO) | Admitting: Family Medicine

## 2018-10-20 VITALS — BP 113/77 | HR 67 | Ht 63.5 in | Wt 142.0 lb

## 2018-10-20 DIAGNOSIS — Z09 Encounter for follow-up examination after completed treatment for conditions other than malignant neoplasm: Secondary | ICD-10-CM

## 2018-10-20 NOTE — Progress Notes (Signed)
   Subjective:    Patient ID: Tara [name] is a 32 y.o. female presenting with Routine Post Op  on 10/20/2018  HPI: Here for post op check following laparoscopic bilateral salpingectomy. Pain is improved. Back to work and feeling well. Normal bowel function.  Review of Systems  Constitutional: Negative for chills and fever.  Respiratory: Negative for shortness of breath.   Cardiovascular: Negative for chest pain.  Gastrointestinal: Negative for abdominal pain, nausea and vomiting.  Genitourinary: Negative for dysuria.  Skin: Negative for rash.      Objective:    BP 113/77   Pulse 67   Ht 5' 3.5" (1.613 m)   Wt 142 lb (64.4 kg)   LMP  (Approximate)   BMI 24.76 kg/m  Physical Exam  Constitutional: She is oriented to person, place, and time. She appears well-developed and well-nourished. No distress.  HENT:  Head: Normocephalic and atraumatic.  Eyes: No scleral icterus.  Neck: Neck supple.  Cardiovascular: Normal rate.  Pulmonary/Chest: Effort normal.  Abdominal: Soft.  Incisions are well healed  Neurological: She is alert and oriented to person, place, and time.  Skin: Skin is warm and dry.  Psychiatric: She has a normal mood and affect.        Assessment & Plan:  Postop check - doing well  Return in about 8 weeks (around 12/15/2018) for a CPE.  Reva Boresanya S  10/20/2018 5:28 PM

## 2018-12-31 ENCOUNTER — Ambulatory Visit: Payer: Managed Care, Other (non HMO) | Admitting: Obstetrics & Gynecology

## 2018-12-31 DIAGNOSIS — Z01419 Encounter for gynecological examination (general) (routine) without abnormal findings: Secondary | ICD-10-CM

## 2019-07-14 ENCOUNTER — Ambulatory Visit: Payer: Managed Care, Other (non HMO) | Admitting: Internal Medicine

## 2019-07-28 ENCOUNTER — Telehealth: Payer: Self-pay

## 2019-07-28 NOTE — Telephone Encounter (Signed)
Patient called stating that she has noticed a decrease in her sex drive. Patient states she had a tubal in oct 2019 and has notice significant difference since then.   Reviewed patient's chart and she was to come back to clinic for annual exam in Jan 2020 and never did. Patient scheduled for annual exam and made her aware that she can discuss her concerns of decreased libido with provider at the visit. Kathrene Alu RN

## 2019-07-28 NOTE — Telephone Encounter (Signed)
Patient called stating she had some questions. Called patient back and left message for her to return call to office. Kathrene Alu RN

## 2019-08-23 ENCOUNTER — Ambulatory Visit: Payer: Managed Care, Other (non HMO) | Admitting: Family Medicine

## 2019-09-07 ENCOUNTER — Ambulatory Visit: Payer: Managed Care, Other (non HMO) | Admitting: Family Medicine

## 2019-09-07 NOTE — Progress Notes (Deleted)
Last pap 06/2018-abnormal   (ASC-US) Flu Shot? Sti testing?

## 2019-09-28 IMAGING — US US MFM OB DETAIL EACH ADDL GEST+14 WK
1 series · 12 of 28 positions shown · non-contrast
Comparison: none

[Series 1: us mfm ob detail each addl gest+14 wk · 12 of 148 slices shown]
[im 6/148]
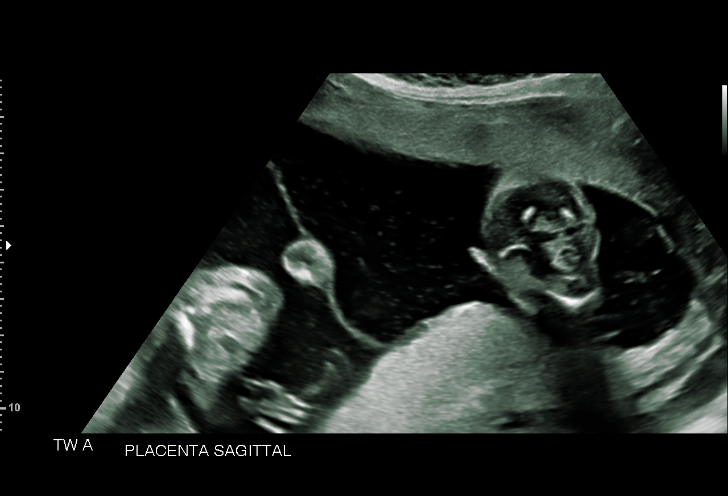
[im 17/148]
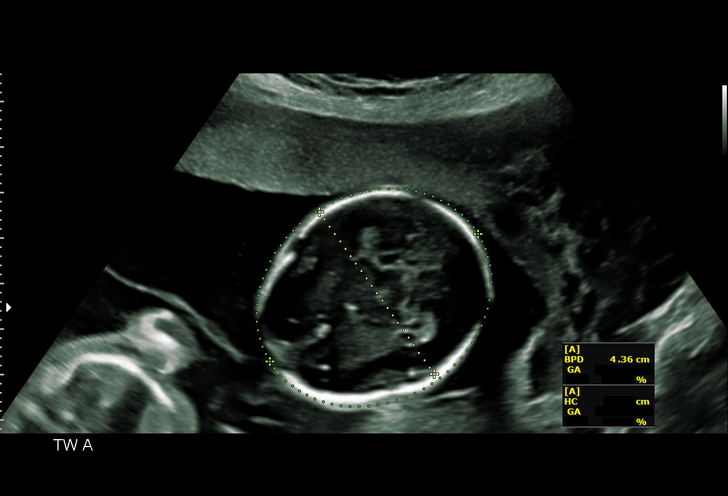
[im 28/148]
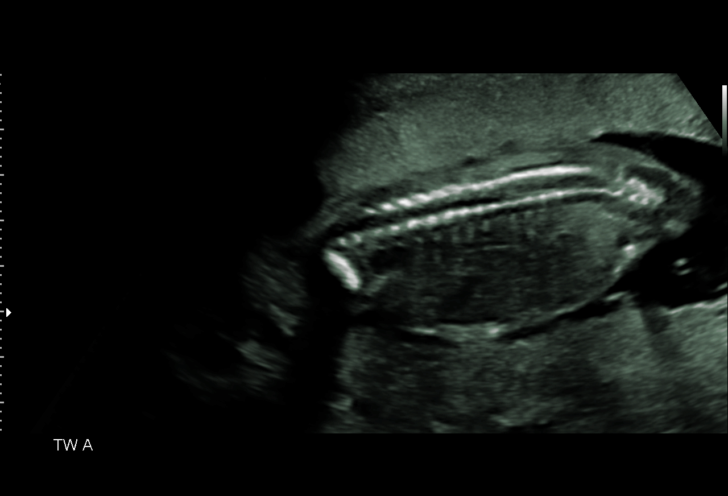
[im 44/148]
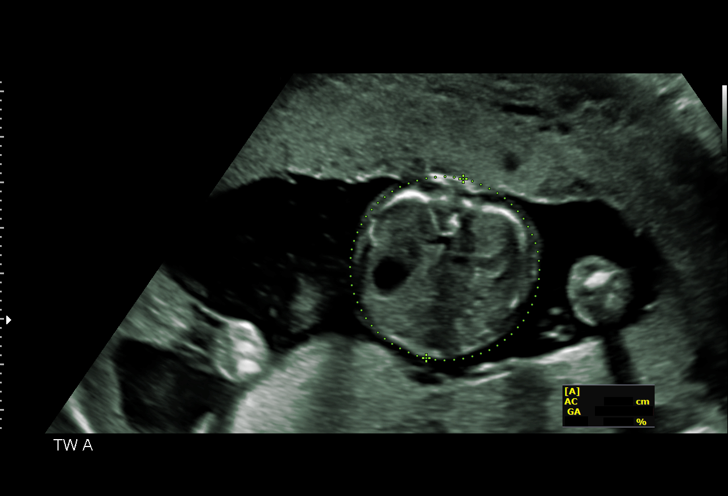
[im 55/148]
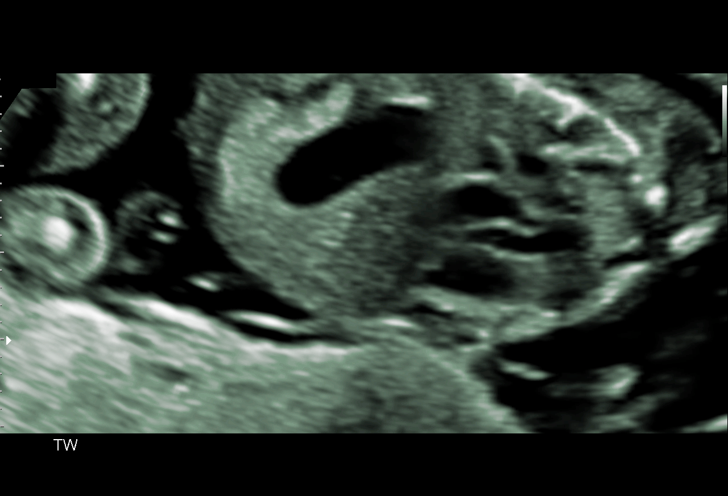
[im 66/148]
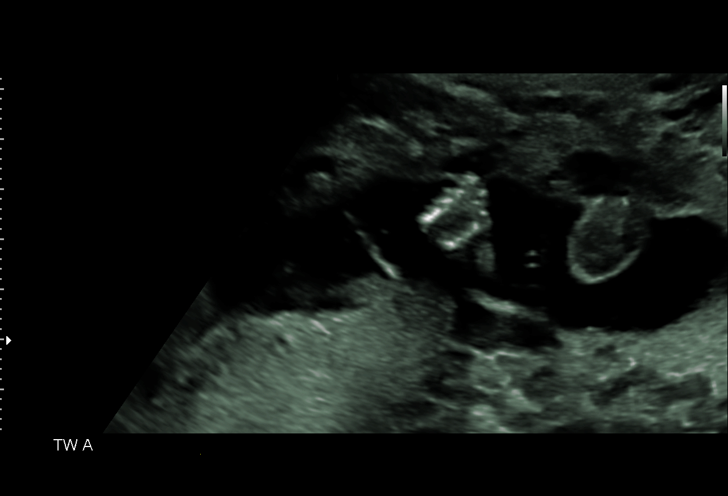
[im 82/148]
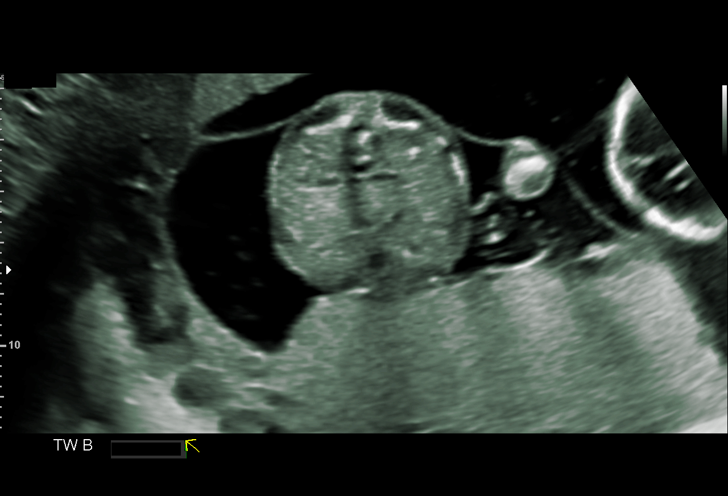
[im 93/148]
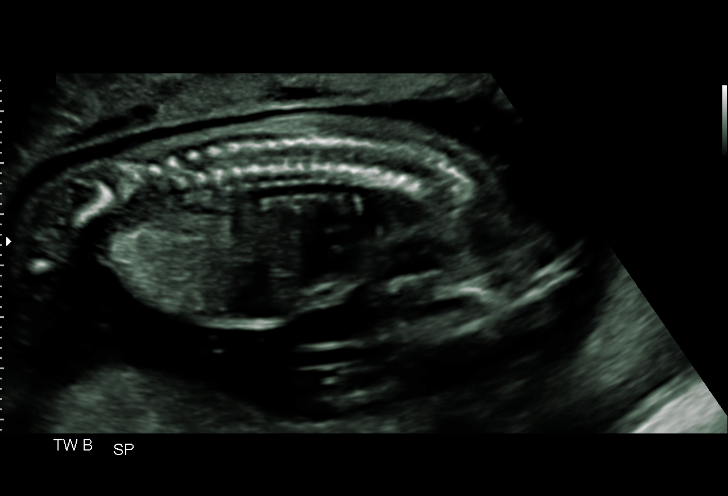
[im 104/148]
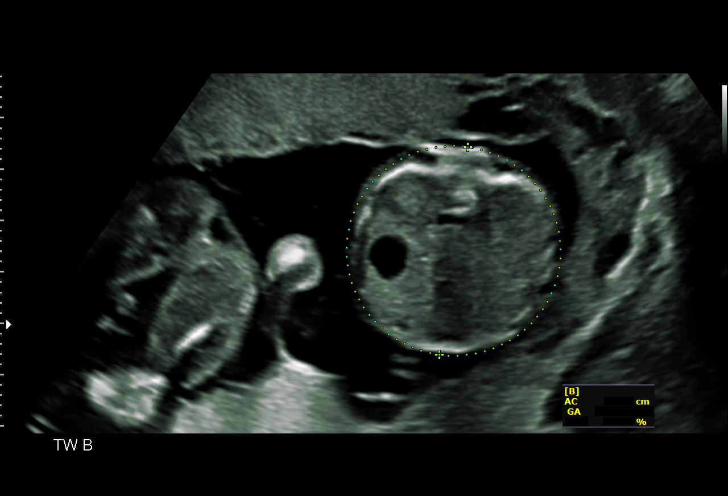
[im 120/148]
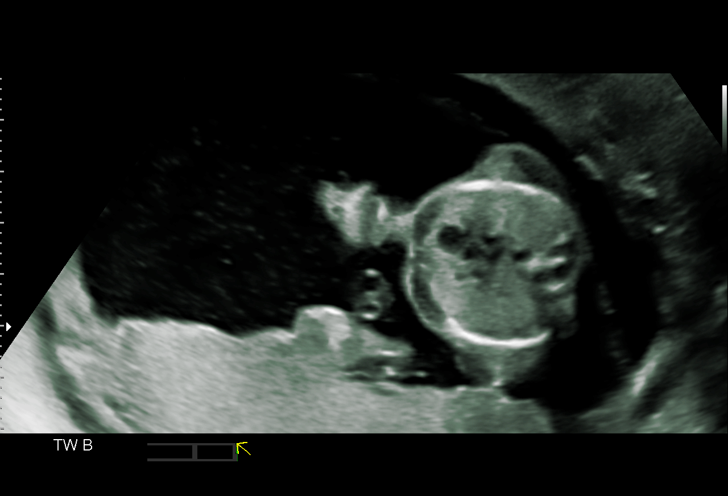
[im 131/148]
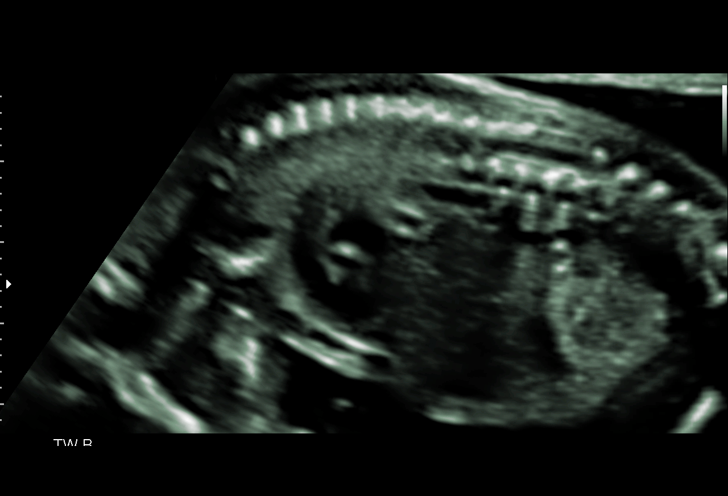
[im 142/148]
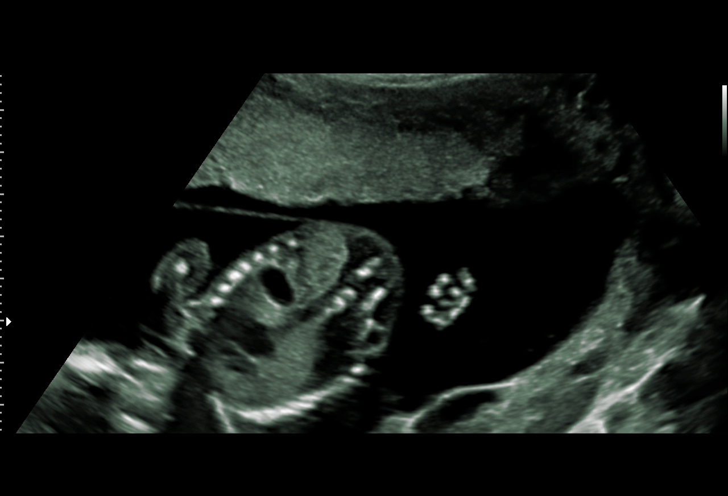

[12 of 28 positions shown; findings below may reference images not displayed]

WK

1  LORRAINE JIM           [id number]      [id number]     [id number]
2  LORRAINE JIM           [id number]      [id number]     [id number]
Service(s) Provided

Indications

19 weeks gestation of pregnancy
Encounter for antenatal screening for
malformations
Twin pregnancy, di/di, second trimester
OB History

Gravidity:    5         Term:   4
Living:       4
Fetal Evaluation (Fetus A)

Num Of Fetuses:     2
Cardiac Activity:   Observed
Fetal Lie:          Maternal left side
Presentation:       Variable
Placenta:           Anterior, above cervical os
P. Cord Insertion:  Visualized, central
Membrane Desc:      Dividing Membrane seen - Dichorionic.

Amniotic Fluid
AFI FV:      Subjectively within normal limits

Largest Pocket(cm)
3.5
Biometry (Fetus A)

BPD:      43.2  mm     G. Age:  19w 0d         54  %    CI:        76.66   %    70 - 86
FL/HC:      17.3   %    16.1 -
HC:      156.3  mm     G. Age:  18w 4d         22  %    HC/AC:      1.22        1.09 -
AC:      127.7  mm     G. Age:  18w 3d         25  %    FL/BPD:     62.7   %
FL:       27.1  mm     G. Age:  18w 2d         20  %    FL/AC:      21.2   %    20 - 24
HUM:      25.5  mm     G. Age:  18w 0d         22  %
CER:      17.9  mm     G. Age:  17w 6d         19  %
NFT:       2.9  mm

CM:        6.8  mm

Est. FW:     236  gm      0 lb 8 oz     32  %     FW Discordancy         2  %
Gestational Age (Fetus A)

LMP:           19w 0d        Date:  10/22/17                 EDD:   07/29/18
U/S Today:     18w 4d                                        EDD:   08/01/18
Best:          19w 0d     Det. By:  LMP  (10/22/17)          EDD:   07/29/18
Anatomy (Fetus A)

Cranium:               Appears normal         Aortic Arch:            Appears normal
Cavum:                 Appears normal         Ductal Arch:            Appears normal
Ventricles:            Appears normal         Diaphragm:              Appears normal
Choroid Plexus:        Appears normal         Stomach:                Appears normal, left
sided
Cerebellum:            Appears normal         Abdomen:                Appears normal
Posterior Fossa:       Appears normal         Abdominal Wall:         Appears nml (cord
insert, abd wall)
Nuchal Fold:           Appears normal         Cord Vessels:           Appears normal (3
vessel cord)
Face:                  Orbits appear          Kidneys:                Appear normal
normal
Thoracic:              Appears normal         Bladder:                Appears normal
Heart:                 Appears normal         Spine:                  Appears normal
(4CH, axis, and
situs)
RVOT:                  Appears normal         Upper Extremities:      Appears normal
LVOT:                  Appears normal         Lower Extremities:      Appears normal

Other:  Female gender. Heels visualized.

Fetal Evaluation (Fetus B)

Num Of Fetuses:     2
Cardiac Activity:   Observed
Fetal Lie:          Maternal right side
Presentation:       Variable
Placenta:           Posterior, above cervical os
P. Cord Insertion:  Visualized, central
Membrane Desc:      Dividing Membrane seen - Dichorionic.

Amniotic Fluid
AFI FV:      Subjectively within normal limits

Largest Pocket(cm)
5.2
Biometry (Fetus B)

BPD:      42.1  mm     G. Age:  18w 5d         39  %    CI:        74.43   %    70 - 86
FL/HC:      17.6   %    16.1 -
HC:      154.9  mm     G. Age:  18w 3d         19  %    HC/AC:      1.19        1.09 -
AC:      130.1  mm     G. Age:  18w 4d         31  %    FL/BPD:     64.6   %
FL:       27.2  mm     G. Age:  18w 2d         21  %    FL/AC:      20.9   %    20 - 24
HUM:      25.9  mm     G. Age:  18w 1d         26  %
CER:      19.4  mm     G. Age:  18w 5d         41  %
NFT:         4  mm
LV:        5.9  mm
CM:        3.9  mm

Est. FW:     240  gm      0 lb 8 oz     34  %     FW Discordancy      0 \ 2 %
Gestational Age (Fetus B)

LMP:           19w 0d        Date:  10/22/17                 EDD:   07/29/18
U/S Today:     18w 4d                                        EDD:   08/01/18
Best:          19w 0d     Det. By:  LMP  (10/22/17)          EDD:   07/29/18
Anatomy (Fetus B)

Cranium:               Appears normal         Aortic Arch:            Appears normal
Cavum:                 Appears normal         Ductal Arch:            Appears normal
Ventricles:            Appears normal         Diaphragm:              Appears normal
Choroid Plexus:        Appears normal         Stomach:                Appears normal, left
sided
Cerebellum:            Appears normal         Abdomen:                Appears normal
Posterior Fossa:       Appears normal         Abdominal Wall:         Appears nml (cord
insert, abd wall)
Nuchal Fold:           Appears normal         Cord Vessels:           Appears normal (3
vessel cord)
Face:                  Orbits appear          Kidneys:                Appear normal
normal
Lips:                  Appears normal         Bladder:                Appears normal
Thoracic:              Appears normal         Spine:                  Appears normal
Heart:                 Appears normal         Upper Extremities:      Appears normal
(4CH, axis, and
situs)
RVOT:                  Appears normal         Lower Extremities:      Appears normal
LVOT:                  Appears normal

Other:  Female gender. Heels and 5th digit visualized.
Cervix Uterus Adnexa

Cervix
Length:              4  cm.
Normal appearance by transabdominal scan.

Left Ovary
Not visualized.

Right Ovary
Within normal limits.
Impression

Diamniotic Dichorionic intrauterine pregnancy at 19+0 weeks
Presentation is cephalic/breech
Normal amniotic fluid in each sac, with  MVP of 3.5 cm for A
and 5.2 cm for B
Estimated fetal weights are 236g for A and 240g for B, 32nd
and 34th percentiles respectively. Discordance is 2%
Review of anatomy shows no evidence of structural
anomalies or sonographic markers for aneuploidy
All relevant anatomy was visualized on today's scan
Recommendations

Recommend follow-up ultrasound examination in 4 weeks for
growth

## 2019-10-28 IMAGING — US US MFM OB FOLLOW-UP EACH ADDL GEST (MODIFY)
1 series · 16 of 28 positions shown · non-contrast
Comparison: none

[Series 1: us mfm ob follow-up each addl gest (modify) · 80 acquisitions, 16 frames shown]
[im 1/80]
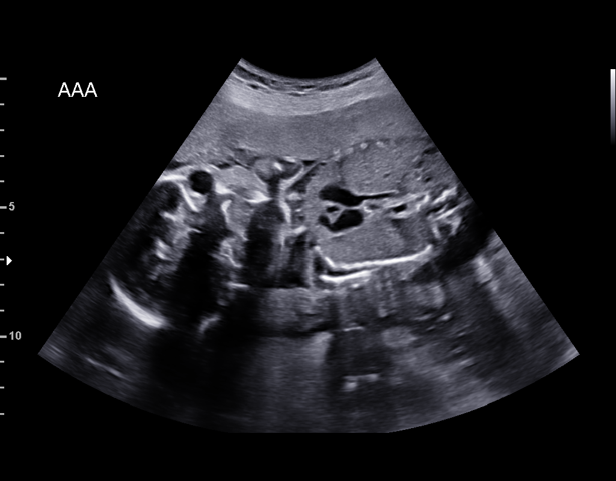
[im 6/80]
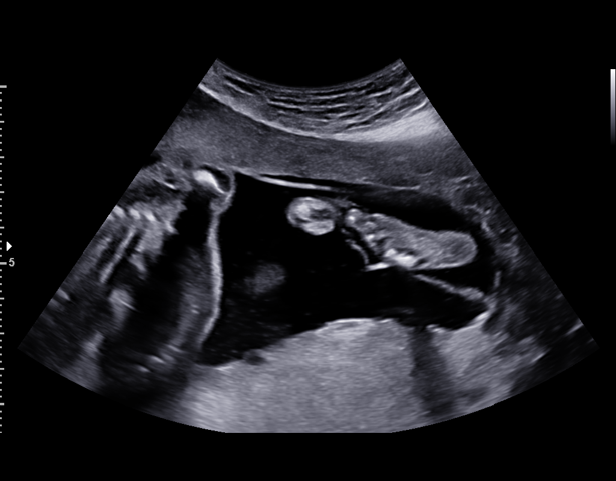
[im 12/80]
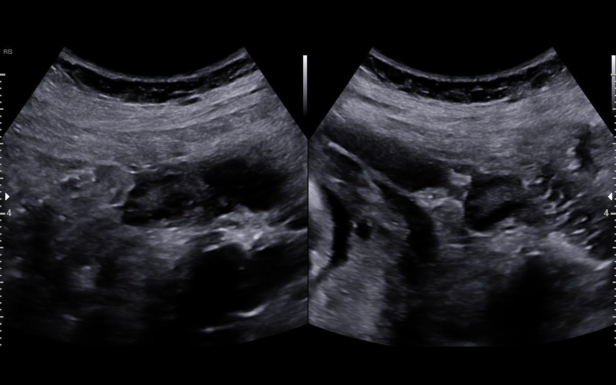
[im 18/80]
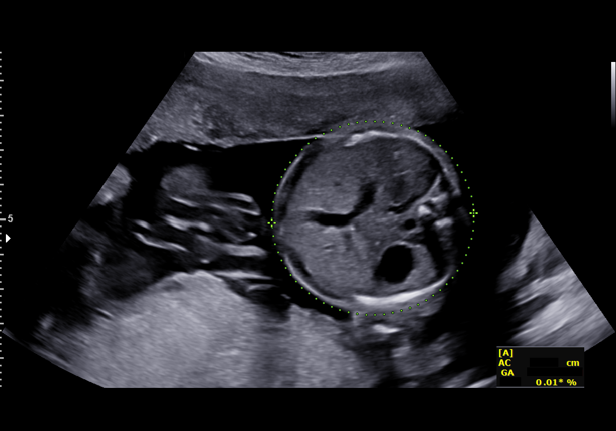
[im 21/80]
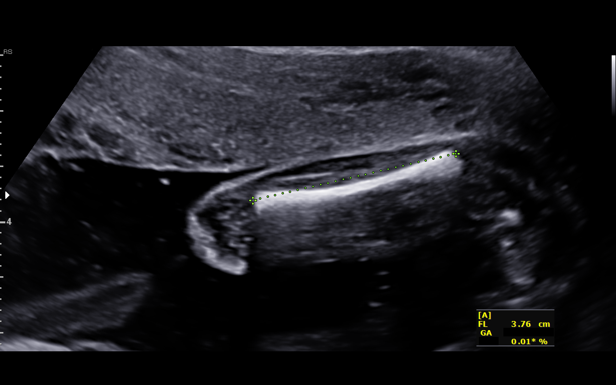
[im 27/80]
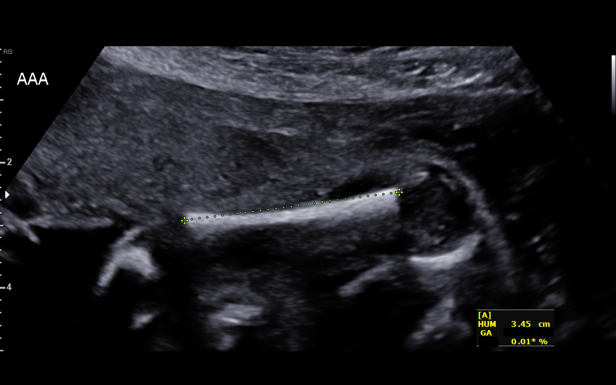
[im 33/80]
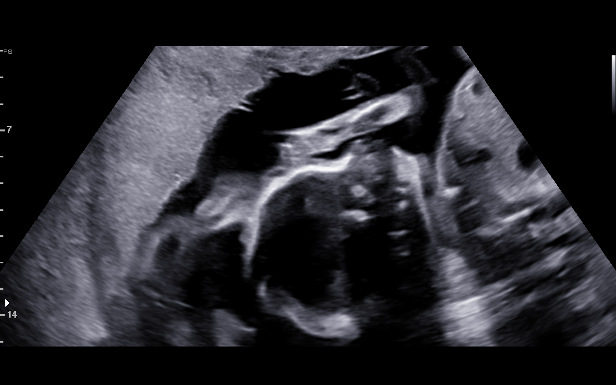
[im 39/80]
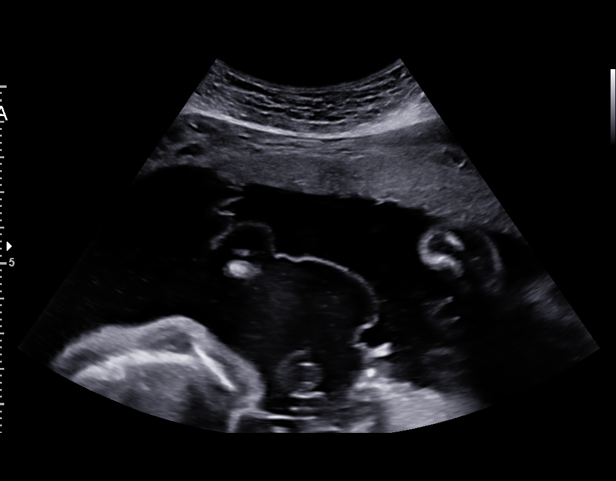
[im 41/80]
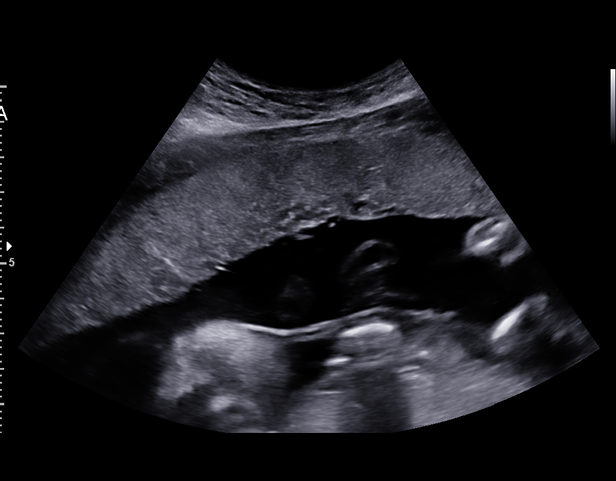
[im 47/80]
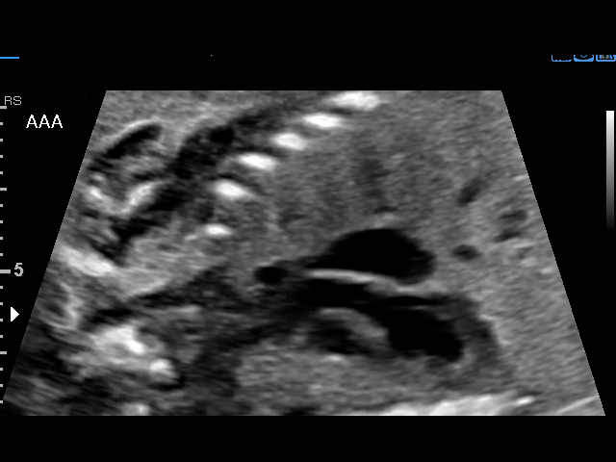
[im 53/80]
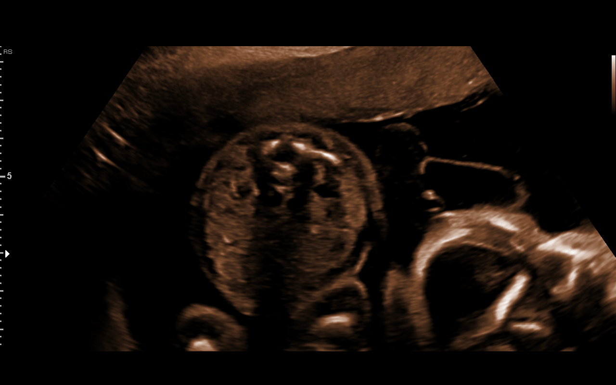
[im 59/80]
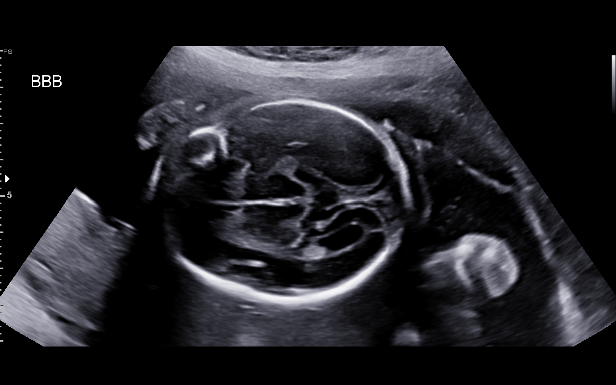
[im 62/80]
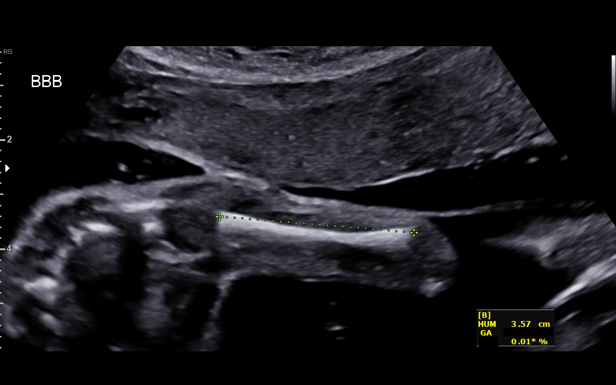
[im 68/80]
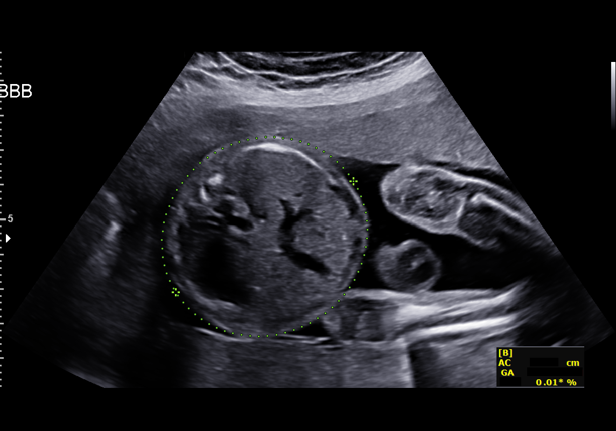
[im 74/80]
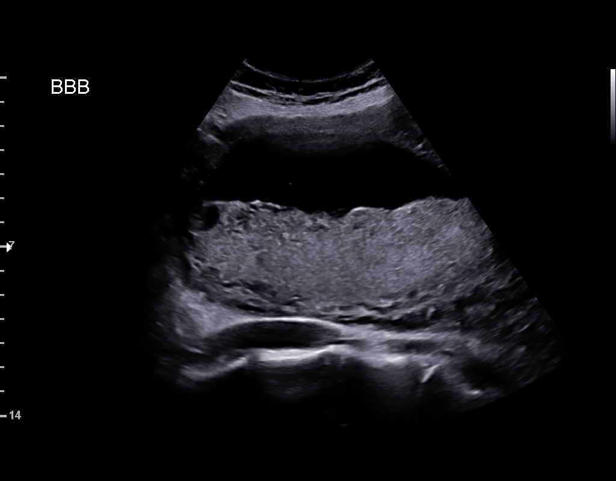
[im 80/80]
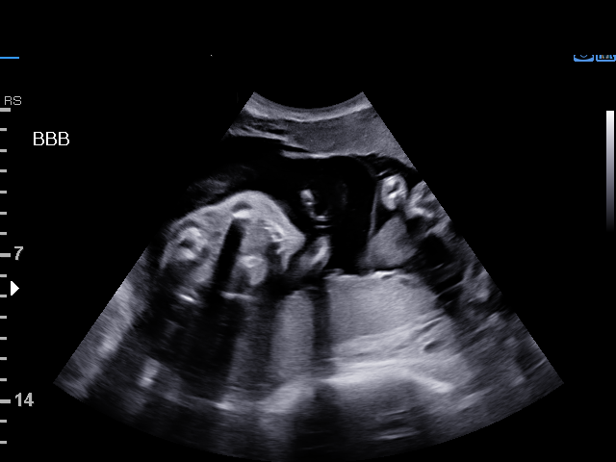

[16 of 28 positions shown; findings below may reference images not displayed]

1  CRISTI BOLGER              [id number]      [id number]     [id number]
2  CRISTI BOLGER              [id number]      [id number]     [id number]
Indications

23 weeks gestation of pregnancy
Twin pregnancy, di/di, second trimester
Encounter for other antenatal screening
follow-up
OB History

Gravidity:    5         Term:   4
Living:       4
Fetal Evaluation (Fetus A)

Num Of Fetuses:     2
Fetal Heart         145
Rate(bpm):
Cardiac Activity:   Observed
Fetal Lie:          Maternal left side
Presentation:       Transverse, head to maternal right
Placenta:           Anterior, above cervical os
P. Cord Insertion:  Previously Visualized

Amniotic Fluid
AFI FV:      Subjectively within normal limits

Largest Pocket(cm)
5.60
Biometry (Fetus A)
BPD:      54.4  mm     G. Age:  22w 4d         20  %    CI:        72.51   %    70 - 86
FL/HC:      18.5   %    19.2 -
HC:      203.2  mm     G. Age:  22w 3d         10  %    HC/AC:      1.11        1.05 -
AC:      182.5  mm     G. Age:  23w 0d         35  %    FL/BPD:     68.9   %    71 - 87
FL:       37.5  mm     G. Age:  22w 0d          8  %    FL/AC:      20.5   %    20 - 24
HUM:      34.3  mm     G. Age:  21w 5d          9  %

Est. FW:     516  gm      1 lb 2 oz     36  %     FW Discordancy         5  %
Gestational Age (Fetus A)

LMP:           23w 2d        Date:  10/22/17                 EDD:   07/29/18
U/S Today:     22w 4d                                        EDD:   08/03/18
Best:          23w 2d     Det. By:  LMP  (10/22/17)          EDD:   07/29/18
Anatomy (Fetus A)

Cranium:               Appears normal         Aortic Arch:            Previously seen
Cavum:                 Previously seen        Ductal Arch:            Previously seen
Ventricles:            Appears normal         Diaphragm:              Previously seen
Choroid Plexus:        Previously seen        Stomach:                Appears normal, left
sided
Cerebellum:            Previously seen        Abdomen:                Appears normal
Posterior Fossa:       Previously seen        Abdominal Wall:         Previously seen
Nuchal Fold:           Not applicable (>20    Cord Vessels:           Previously seen
wks GA)
Face:                  Orbits previously      Kidneys:                Appear normal
seen
Thoracic:              Appears normal         Bladder:                Appears normal
Heart:                 Previously seen        Spine:                  Previously seen
RVOT:                  Previously seen        Upper Extremities:      Previously seen
LVOT:                  Appears normal         Lower Extremities:      Previously seen

Other:  Female gender. Heels previously visualized.

Fetal Evaluation (Fetus B)

Num Of Fetuses:     2
Fetal Heart         145
Rate(bpm):
Cardiac Activity:   Observed
Fetal Lie:          Maternal right side
Presentation:       Transverse, head to maternal left
Placenta:           Posterior, above cervical os
P. Cord Insertion:  Previously Visualized

Amniotic Fluid
AFI FV:      Subjectively within normal limits

Largest Pocket(cm)
6.65
Biometry (Fetus B)

BPD:      55.2  mm     G. Age:  22w 6d         28  %    CI:        73.42   %    70 - 86
FL/HC:      18.8   %    19.2 -
HC:      204.7  mm     G. Age:  22w 4d         13  %    HC/AC:      1.10        1.05 -
AC:      186.6  mm     G. Age:  23w 3d         47  %    FL/BPD:     69.6   %    71 - 87
FL:       38.4  mm     G. Age:  22w 2d         13  %    FL/AC:      20.6   %    20 - 24
HUM:      35.2  mm     G. Age:  22w 1d         18  %
Est. FW:     545  gm      1 lb 3 oz     45  %     FW Discordancy      0 \ 5 %
Gestational Age (Fetus B)

LMP:           23w 2d        Date:  10/22/17                 EDD:   07/29/18
U/S Today:     22w 6d                                        EDD:   08/01/18
Best:          23w 2d     Det. By:  LMP  (10/22/17)          EDD:   07/29/18
Anatomy (Fetus B)

Cranium:               Appears normal         Aortic Arch:            Previously seen
Cavum:                 Previously seen        Ductal Arch:            Previously seen
Ventricles:            Previously seen        Diaphragm:              Previously seen
Choroid Plexus:        Previously seen        Stomach:                Appears normal, left
sided
Cerebellum:            Previously seen        Abdomen:                Appears normal
Posterior Fossa:       Previously seen        Abdominal Wall:         Previously seen
Nuchal Fold:           Not applicable (>20    Cord Vessels:           Previously seen
wks GA)
Face:                  Orbits previously      Kidneys:                Appear normal
seen
Lips:                  Previously seen        Bladder:                Appears normal
Thoracic:              Appears normal         Spine:                  Previously seen
Heart:                 Previously seen        Upper Extremities:      Previously seen
RVOT:                  Previously seen        Lower Extremities:      Previously seen
LVOT:                  Previously seen

Other:  Female gender. Heels and 5th digit previously visualized.
Cervix Uterus Adnexa

Cervix
Length:           4.28  cm.
Normal appearance by transabdominal scan.

Uterus
No abnormality visualized.

Left Ovary
Within normal limits.

Right Ovary
Not visualized.

Adnexa:       No abnormality visualized. No adnexal mass
visualized.
Impression

Dichorionic/diamniotic twin pregnancy at 23+2 weeks
Twin A:
Presentation is transverse
Interval review of fetal anatomy was normal
Normal amniotic fluid volume
Appropriate interval growth with EFW at the 36th percentile
Twin B:
Presentation is transverse
Interval review of fetal anatomy was normal
Normal amniotic fluid volume
Appropriate interval growth with EFW at the 45th percentile
Dicordance is 5%
Recommendations

Repeat scan in 4 weeks

## 2020-02-03 ENCOUNTER — Ambulatory Visit: Payer: Managed Care, Other (non HMO) | Admitting: Family Medicine

## 2020-03-14 ENCOUNTER — Ambulatory Visit: Payer: Managed Care, Other (non HMO) | Admitting: Family Medicine

## 2020-03-14 ENCOUNTER — Encounter: Payer: Self-pay | Admitting: Family Medicine

## 2020-03-14 DIAGNOSIS — Z01419 Encounter for gynecological examination (general) (routine) without abnormal findings: Secondary | ICD-10-CM

## 2020-03-14 NOTE — Progress Notes (Signed)
Patient did not keep appointment today. She may call to reschedule.  

## 2020-06-20 ENCOUNTER — Ambulatory Visit (INDEPENDENT_AMBULATORY_CARE_PROVIDER_SITE_OTHER): Admitting: *Deleted

## 2020-06-20 ENCOUNTER — Other Ambulatory Visit (HOSPITAL_COMMUNITY)
Admission: RE | Admit: 2020-06-20 | Discharge: 2020-06-20 | Disposition: A | Discharge status: Home / Self Care | Source: Ambulatory Visit | Attending: Obstetrics & Gynecology | Admitting: Obstetrics & Gynecology

## 2020-06-20 ENCOUNTER — Other Ambulatory Visit: Payer: Self-pay

## 2020-06-20 VITALS — BP 128/84 | HR 88

## 2020-06-20 DIAGNOSIS — N760 Acute vaginitis: Secondary | ICD-10-CM

## 2020-06-20 DIAGNOSIS — N898 Other specified noninflammatory disorders of vagina: Secondary | ICD-10-CM | POA: Insufficient documentation

## 2020-06-20 DIAGNOSIS — B3731 Acute candidiasis of vulva and vagina: Secondary | ICD-10-CM

## 2020-06-20 NOTE — Progress Notes (Signed)
SUBJECTIVE:  34 y.o. female complains of vaginal irritation and discharge for a few day(s). Denies abnormal vaginal bleeding or significant pelvic pain or fever. No UTI symptoms. Denies history of known exposure to STD.  No LMP recorded.  OBJECTIVE:  She appears well, afebrile. Urine dipstick: not done.  ASSESSMENT:  Vaginal Discharge  Vaginal Odor   PLAN:  GC, chlamydia, trichomonas, BVAG, CVAG probe sent to lab. Treatment: To be determined once lab results are received ROV prn if symptoms persist or worsen.

## 2020-06-20 NOTE — Progress Notes (Signed)
Patient was assessed and managed by nursing staff during this encounter. I have reviewed the chart and agree with the documentation and plan. I have also made any necessary editorial changes.  Jaynie Collins, MD 06/20/2020 2:55 PM

## 2020-06-21 LAB — CERVICOVAGINAL ANCILLARY ONLY
Bacterial Vaginitis (gardnerella): POSITIVE — AB
Candida Glabrata: NEGATIVE
Candida Vaginitis: POSITIVE — AB
Chlamydia: NEGATIVE
Comment: NEGATIVE
Comment: NEGATIVE
Comment: NEGATIVE
Comment: NEGATIVE
Comment: NEGATIVE
Comment: NORMAL
Neisseria Gonorrhea: NEGATIVE
Trichomonas: NEGATIVE

## 2020-06-22 MED ORDER — FLUCONAZOLE 150 MG PO TABS: 150.0000 mg | ORAL_TABLET | Freq: Once | ORAL | 3 refills | Status: AC

## 2020-06-22 MED ORDER — METRONIDAZOLE 500 MG PO TABS: 500.0000 mg | ORAL_TABLET | Freq: Two times a day (BID) | ORAL | 2 refills | Status: AC

## 2020-06-22 NOTE — Addendum Note (Signed)
Addended by: Jaynie Collins A on: 06/22/2020 11:45 AM   Modules accepted: Orders

## 2020-07-03 ENCOUNTER — Ambulatory Visit: Payer: Managed Care, Other (non HMO) | Admitting: Obstetrics and Gynecology

## 2020-07-03 ENCOUNTER — Encounter: Payer: Self-pay | Admitting: Obstetrics and Gynecology

## 2020-07-04 NOTE — Progress Notes (Signed)
Patient did not keep her GYN appointment for 07/03/2020.  Cornelia Copa MD Attending [facility] [employer])

## 2020-08-03 ENCOUNTER — Telehealth: Payer: Self-pay | Admitting: *Deleted

## 2020-08-03 NOTE — Telephone Encounter (Signed)
-----   Message from Rozann Lesches, NT sent at 08/02/2020  1:54 PM EDT ----- Regarding: check with Dr A Patient did not have cycle in August has had one up to last month. Had tubes removed 10/19.

## 2020-08-03 NOTE — Telephone Encounter (Signed)
Called pt back after speaking with Dr A. Informed pt that missing a cycle was not concerning and she can take a pregnancy test if she would like. Pt states she did and it was negative. Informed that to see how this month goes and if this continues to be an issues than she can make an appointment.

## 2020-09-08 ENCOUNTER — Emergency Department

## 2020-09-08 ENCOUNTER — Other Ambulatory Visit: Payer: Self-pay

## 2020-09-08 ENCOUNTER — Emergency Department
Admission: EM | Admit: 2020-09-08 | Discharge: 2020-09-08 | Disposition: A | Discharge status: Home / Self Care | Attending: Emergency Medicine | Admitting: Emergency Medicine

## 2020-09-08 DIAGNOSIS — M546 Pain in thoracic spine: Secondary | ICD-10-CM | POA: Diagnosis present

## 2020-09-08 DIAGNOSIS — Z5321 Procedure and treatment not carried out due to patient leaving prior to being seen by health care provider: Secondary | ICD-10-CM | POA: Insufficient documentation

## 2020-09-08 LAB — COMPREHENSIVE METABOLIC PANEL
ALT: 13 U/L (ref 0–44)
AST: 21 U/L (ref 15–41)
Albumin: 4.5 g/dL (ref 3.5–5.0)
Alkaline Phosphatase: 86 U/L (ref 38–126)
Anion gap: 11 (ref 5–15)
BUN: 11 mg/dL (ref 6–20)
CO2: 24 mmol/L (ref 22–32)
Calcium: 8.9 mg/dL (ref 8.9–10.3)
Chloride: 104 mmol/L (ref 98–111)
Creatinine, Ser: 0.74 mg/dL (ref 0.44–1.00)
GFR, Estimated: >60 mL/min (ref >60)
Glucose, Bld: 111 mg/dL — ABNORMAL HIGH (ref 70–99)
Potassium: 3.3 mmol/L — ABNORMAL LOW (ref 3.5–5.1)
Sodium: 139 mmol/L (ref 135–145)
Total Bilirubin: 0.9 mg/dL (ref 0.3–1.2)
Total Protein: 7.9 g/dL (ref 6.5–8.1)

## 2020-09-08 LAB — CBC WITH DIFFERENTIAL/PLATELET
Abs Immature Granulocytes: 0.04 10*3/uL (ref 0.00–0.07)
Basophils Absolute: 0.1 10*3/uL (ref 0.0–0.1)
Basophils Relative: 1 %
Eosinophils Absolute: 0 10*3/uL (ref 0.0–0.5)
Eosinophils Relative: 0 %
HCT: 42.1 % (ref 36.0–46.0)
Hemoglobin: 14.8 g/dL (ref 12.0–15.0)
Immature Granulocytes: 1 %
Lymphocytes Relative: 39 %
Lymphs Abs: 2.5 10*3/uL (ref 0.7–4.0)
MCH: 31.4 pg (ref 26.0–34.0)
MCHC: 35.2 g/dL (ref 30.0–36.0)
MCV: 89.4 fL (ref 80.0–100.0)
Monocytes Absolute: 0.5 10*3/uL (ref 0.1–1.0)
Monocytes Relative: 7 %
Neutro Abs: 3.3 10*3/uL (ref 1.7–7.7)
Neutrophils Relative %: 52 %
Platelets: 451 10*3/uL — ABNORMAL HIGH (ref 150–400)
RBC: 4.71 MIL/uL (ref 3.87–5.11)
RDW: 11.9 % (ref 11.5–15.5)
WBC: 6.4 10*3/uL (ref 4.0–10.5)
nRBC: 0 % (ref 0.0–0.2)

## 2020-09-08 LAB — FIBRIN DERIVATIVES D-DIMER (ARMC ONLY): Fibrin derivatives D-dimer (ARMC): 1271.02 ng/mL (FEU) — ABNORMAL HIGH (ref 0.00–499.00)

## 2020-09-08 NOTE — ED Notes (Signed)
Pt states she will sign a waiver against pregnancy, pt states she has had her fallopian tubes removed, and "I can't be pregnant, me and my doctor will have a problem".

## 2020-09-08 NOTE — ED Notes (Signed)
Pt refusing IV at this time.

## 2020-09-08 NOTE — ED Triage Notes (Signed)
Pt complains of mid back pain that is stinging and burning, throbbing. Pt ambulatory without difficulty. Pt appears in no acute distress. Pt denies dysuira, fever.

## 2020-09-08 NOTE — ED Notes (Addendum)
Patient called for vital signs check with no answer 

## 2020-09-11 ENCOUNTER — Telehealth: Payer: Self-pay | Admitting: Emergency Medicine

## 2020-09-11 NOTE — Telephone Encounter (Addendum)
Called patient due to lwot to inquire about condition and follow up plans. Left message  Tara [name] called me back.   I told her that d dimer was elevated and that she should return for evaluation and possible CT scan and that it can mean she has a clot.  She has had some deaths in her family and is out of town. She told me that she would be back in a couple days.  She does not have a pcp.   I told her that she could possibly go to urgent care and they could review our labs.

## 2020-09-13 ENCOUNTER — Other Ambulatory Visit: Payer: Self-pay

## 2020-09-13 ENCOUNTER — Emergency Department

## 2020-09-13 ENCOUNTER — Emergency Department
Admission: EM | Admit: 2020-09-13 | Discharge: 2020-09-14 | Disposition: A | Discharge status: Home / Self Care | Attending: Emergency Medicine | Admitting: Emergency Medicine

## 2020-09-13 DIAGNOSIS — R7989 Other specified abnormal findings of blood chemistry: Secondary | ICD-10-CM

## 2020-09-13 DIAGNOSIS — U071 COVID-19: Secondary | ICD-10-CM | POA: Diagnosis not present

## 2020-09-13 DIAGNOSIS — R791 Abnormal coagulation profile: Secondary | ICD-10-CM | POA: Diagnosis present

## 2020-09-13 LAB — COMPREHENSIVE METABOLIC PANEL
ALT: 11 U/L (ref 0–44)
AST: 16 U/L (ref 15–41)
Albumin: 4.2 g/dL (ref 3.5–5.0)
Alkaline Phosphatase: 87 U/L (ref 38–126)
Anion gap: 10 (ref 5–15)
BUN: 8 mg/dL (ref 6–20)
CO2: 26 mmol/L (ref 22–32)
Calcium: 9 mg/dL (ref 8.9–10.3)
Chloride: 104 mmol/L (ref 98–111)
Creatinine, Ser: 0.6 mg/dL (ref 0.44–1.00)
GFR, Estimated: >60 mL/min (ref >60)
Glucose, Bld: 114 mg/dL — ABNORMAL HIGH (ref 70–99)
Potassium: 3.7 mmol/L (ref 3.5–5.1)
Sodium: 140 mmol/L (ref 135–145)
Total Bilirubin: 0.7 mg/dL (ref 0.3–1.2)
Total Protein: 7.8 g/dL (ref 6.5–8.1)

## 2020-09-13 LAB — POCT PREGNANCY, URINE: Preg Test, Ur: NEGATIVE

## 2020-09-13 LAB — URINALYSIS, COMPLETE (UACMP) WITH MICROSCOPIC
Bilirubin Urine: NEGATIVE
Glucose, UA: NEGATIVE mg/dL
Hgb urine dipstick: NEGATIVE
Ketones, ur: NEGATIVE mg/dL
Leukocytes,Ua: NEGATIVE
Nitrite: NEGATIVE
Protein, ur: NEGATIVE mg/dL
Specific Gravity, Urine: 1.004 — ABNORMAL LOW (ref 1.005–1.030)
pH: 7 (ref 5.0–8.0)

## 2020-09-13 LAB — CBC WITH DIFFERENTIAL/PLATELET
Abs Immature Granulocytes: 0.02 10*3/uL (ref 0.00–0.07)
Basophils Absolute: 0 10*3/uL (ref 0.0–0.1)
Basophils Relative: 1 %
Eosinophils Absolute: 0 10*3/uL (ref 0.0–0.5)
Eosinophils Relative: 0 %
HCT: 38.8 % (ref 36.0–46.0)
Hemoglobin: 13.5 g/dL (ref 12.0–15.0)
Immature Granulocytes: 0 %
Lymphocytes Relative: 44 %
Lymphs Abs: 2.3 10*3/uL (ref 0.7–4.0)
MCH: 31.2 pg (ref 26.0–34.0)
MCHC: 34.8 g/dL (ref 30.0–36.0)
MCV: 89.6 fL (ref 80.0–100.0)
Monocytes Absolute: 0.3 10*3/uL (ref 0.1–1.0)
Monocytes Relative: 5 %
Neutro Abs: 2.6 10*3/uL (ref 1.7–7.7)
Neutrophils Relative %: 50 %
Platelets: 429 10*3/uL — ABNORMAL HIGH (ref 150–400)
RBC: 4.33 MIL/uL (ref 3.87–5.11)
RDW: 12 % (ref 11.5–15.5)
WBC: 5.2 10*3/uL (ref 4.0–10.5)
nRBC: 0 % (ref 0.0–0.2)

## 2020-09-13 LAB — POC URINE PREG, ED: Preg Test, Ur: NEGATIVE

## 2020-09-13 LAB — RESP PANEL BY RT PCR (RSV, FLU A&B, COVID)
Influenza A by PCR: NEGATIVE
Influenza B by PCR: NEGATIVE
Respiratory Syncytial Virus by PCR: NEGATIVE
SARS Coronavirus 2 by RT PCR: POSITIVE — AB

## 2020-09-13 MED ORDER — IOHEXOL 350 MG/ML SOLN
75.0000 mL | Freq: Once | INTRAVENOUS | Status: AC | PRN
  Administered 2020-09-13: 75 mL via INTRAVENOUS

## 2020-09-13 NOTE — Discharge Instructions (Addendum)
Even though your labs and XRay looked reassuring today, COVID-19 is a rapidly changing disease and it is very important that you return to the ER immediately if you develop any worsening shortness of breath (in particular, if you feel short of breath at rest or when just moving a small amount)  Drink plenty of fluids  STAY HOME and SELF ISOLATE  Take TYLENOL/ACETAMINOPHEN for fever. Take according to the bottle directions.   Viral syndrome and Novel Coronavirus (COVID-19) 02/22/2019  COVID Hotline: 1-336-70COVID   You have a viral illness which can have symptoms like muscle aches, fevers, chills, runny nose, cough, sneezing, sore throat, vomiting or diarrhea. One of the viruses that can cause this is SARS- CoV-2, the virus that causes COVID-19, also known as the novel coronavirus. You could also have a different viral infection such as the common cold or flu. Most patients with viral illness including COVID-19 have mild symptoms and recover on their own. Resting, staying hydrated, and sleeping are helpful. Today we think you are well enough to go home and treat your symptoms with oral liquids, and medicine for fevers, cough, and pain.  We generally do not do COVID-19 testing on people with mild symptoms who are being discharged from the Emergency Department or Clinic.  If we did a test for COVID-19 the results will not be available for several days. We will call you with the result. Please DO NOT CONTACT THE EMERGENCY DEPARTMENT OR CLINIC FOR RESULTS OF THIS TEST.  Please follow the precautions below:       Stay home for at least 7 days after your symptoms began OR for 3 days after your fever ends, whichever takes longer. ?      If people live with you they should also stay home and avoid contact with others for 14 days.?   To help boost your immune system against COVID-19, please take:  - Vitamin D3 4,000 IU/day - Vitamin C 500-1,000?mg twice a day - Quercetin 250?mg twice a day -  Zinc 100?mg/day - Melatonin 10?mg before bedtime (causes drowsiness) - Aspirin 325?mg/day (unless contraindicated) - Pulse Oximeter Monitoring of oxygen saturation is recommended - check your oxygen 3 times a day if less than 90% return to the ER  These medications are all over-the-counter and do not need a prescription.

## 2020-09-13 NOTE — ED Provider Notes (Signed)
[facility] Emergency Department Provider Note  ____________________________________________   None    (approximate)  I have reviewed the triage vital signs and the nursing notes.   HISTORY Chief Complaint Abnormal Lab    HPI Tara [name] is a 34 y.o. female here with abnormal lab.  The patient states that she was called by her PCP due to positive D-dimer sent during recent ED visit several days ago.  She had checked in at that time because she was having some lower back pain.  She has a history of chronic lower back pain since her epidural 2 years ago, and denies any ongoing pain.  She states she had some mild body aches at that time as well.  These have all resolved.  She denies any complaints currently.  Specifically, no lower extremity swelling or pain.  No chest pain or shortness of breath.  No nausea, vomiting, or diarrhea.  No fevers.        Past Medical History:  Diagnosis Date  . Menorrhagia with regular cycle 12/17/2016  . Vaginal Pap smear, abnormal     Patient Active Problem List   Diagnosis Date Noted  . ASCUS of cervix with negative high risk HPV on 12/29/2017 12/31/2017  . 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 COLPOSCOPY    . LAPAROSCOPIC BILATERAL SALPINGECTOMY Bilateral 09/18/2018   Procedure: LAPAROSCOPIC BILATERAL SALPINGECTOMY;  Surgeon: Reva Bores, MD;  Location: [facility];  Service: Gynecology;  Laterality: Bilateral;    Prior to Admission medications   Medication Sig Start Date End Date Taking? Authorizing Provider  acetaminophen (TYLENOL) 325 MG tablet Take 650 mg by mouth every 6 (six) hours as needed.    [provider]  ibuprofen (ADVIL,MOTRIN) 600 MG tablet Take 1 tablet (600 mg total) by mouth every 6 (six) hours as needed. Patient not taking: Reported on 10/20/2018 09/18/18   Reva Bores, MD  oxyCODONE-acetaminophen  (PERCOCET/ROXICET) 5-325 MG tablet Take 1-2 tablets by mouth every 6 (six) hours as needed. Patient not taking: Reported on 10/20/2018 09/18/18   Reva Bores, MD    Allergies Patient has no known allergies.  Family History  Problem Relation Age of Onset  . Hypertension Mother   . Arthritis Maternal Grandmother   . Alcohol abuse Neg Hx   . Asthma Neg Hx   . Birth defects Neg Hx   . Cancer Neg Hx   . COPD Neg Hx   . Diabetes Neg Hx   . Depression Neg Hx   . Drug abuse Neg Hx   . Early death Neg Hx   . Hearing loss Neg Hx   . Heart disease Neg Hx   . Hyperlipidemia Neg Hx   . Kidney disease Neg Hx   . Learning disabilities Neg Hx   . Mental illness Neg Hx   . Mental retardation Neg Hx   . Miscarriages / Stillbirths Neg Hx   . Stroke Neg Hx   . Vision loss Neg Hx   . Varicose Veins Neg Hx     Social History Social History   Tobacco Use  . Smoking status: Never Smoker  . Smokeless tobacco: Never Used  Vaping Use  . Vaping Use: Never used  Substance Use Topics  . Alcohol use: No    Alcohol/week: 0.0 standard drinks  . Drug use: Never    Review of Systems  Review of Systems  Constitutional:  Negative for chills and fever.  HENT: Negative for sore throat.   Respiratory: Negative for shortness of breath.   Cardiovascular: Negative for chest pain.  Gastrointestinal: Negative for abdominal pain.  Genitourinary: Negative for flank pain.  Musculoskeletal: Positive for arthralgias. Negative for neck pain.  Skin: Negative for rash and wound.  Allergic/Immunologic: Negative for immunocompromised state.  Neurological: Negative for weakness and numbness.  Hematological: Does not bruise/bleed easily.     ____________________________________________  PHYSICAL EXAM:      VITAL SIGNS: ED Triage Vitals [09/13/20 1858]  Enc Vitals Group     BP 132/82     Pulse Rate (!) 112     Resp 18     Temp 99.6 F (37.6 C)     Temp Source Oral     SpO2 99 %     Weight       Height      Head Circumference      Peak Flow      Pain Score      Pain Loc      Pain Edu?      Excl. in GC?      Physical Exam Vitals and nursing note reviewed.  Constitutional:      General: She is not in acute distress.    Appearance: She is well-developed.  HENT:     Head: Normocephalic and atraumatic.  Eyes:     Conjunctiva/sclera: Conjunctivae normal.  Cardiovascular:     Rate and Rhythm: Normal rate and regular rhythm.     Heart sounds: Normal heart sounds.  Pulmonary:     Effort: Pulmonary effort is normal. No respiratory distress.     Breath sounds: No wheezing.  Abdominal:     General: There is no distension.  Musculoskeletal:     Cervical back: Neck supple.  Skin:    General: Skin is warm.     Capillary Refill: Capillary refill takes less than 2 seconds.     Findings: No rash.  Neurological:     Mental Status: She is alert and oriented to person, place, and time.     Motor: No abnormal muscle tone.       ____________________________________________   LABS (all labs ordered are listed, but only abnormal results are displayed)  Labs Reviewed  RESP PANEL BY RT PCR (RSV, FLU A&B, COVID) - Abnormal; Notable for the following components:      Result Value   SARS Coronavirus 2 by RT PCR POSITIVE (*)    All other components within normal limits  CBC WITH DIFFERENTIAL/PLATELET - Abnormal; Notable for the following components:   Platelets 429 (*)    All other components within normal limits  COMPREHENSIVE METABOLIC PANEL - Abnormal; Notable for the following components:   Glucose, Bld 114 (*)    All other components within normal limits  URINALYSIS, COMPLETE (UACMP) WITH MICROSCOPIC - Abnormal; Notable for the following components:   Color, Urine STRAW (*)    APPearance CLEAR (*)    Specific Gravity, Urine 1.004 (*)    Bacteria, UA RARE (*)    All other components within normal limits  POC URINE PREG, ED  POCT PREGNANCY, URINE     ____________________________________________  EKG:  ________________________________________  RADIOLOGY All imaging, including plain films, CT scans, and ultrasounds, independently reviewed by me, and interpretations confirmed via formal radiology reads.  ED MD interpretation:   CXR: Clear  Official radiology report(s): DG Chest 2 View  Result Date: 09/13/2020 CLINICAL DATA:  Chest  pain.  Elevated D-dimer EXAM: CHEST - 2 VIEW COMPARISON:  None. FINDINGS: Lungs are clear. Heart size and pulmonary vascularity are normal. No adenopathy. No pneumothorax. No bone lesions. IMPRESSION: Lungs clear.  Cardiac silhouette normal. Electronically Signed   By: Bretta Bang III M.D.   On: 09/13/2020 19:40   CT Angio Chest PE W and/or Wo Contrast  Result Date: 09/13/2020 CLINICAL DATA:  Elevated D-dimer, COVID positive EXAM: CT ANGIOGRAPHY CHEST WITH CONTRAST TECHNIQUE: Multidetector CT imaging of the chest was performed using the standard protocol during bolus administration of intravenous contrast. Multiplanar CT image reconstructions and MIPs were obtained to evaluate the vascular anatomy. CONTRAST:  69mL OMNIPAQUE IOHEXOL 350 MG/ML SOLN COMPARISON:  None. FINDINGS: Cardiovascular: Satisfactory opacification of the pulmonary arteries to the segmental level. No evidence of pulmonary embolism. Normal heart size. No pericardial effusion. The thoracic aorta is unremarkable. Mediastinum/Nodes: Thyroid unremarkable. No pathologic thoracic adenopathy. Tiny hiatal hernia. Lungs/Pleura: Lungs are clear. No pleural effusion or pneumothorax. Upper Abdomen: No acute abnormality. Musculoskeletal: No chest wall abnormality. No acute or significant osseous findings. Review of the MIP images confirms the above findings. IMPRESSION: No pulmonary embolism. Tiny hiatal hernia. Otherwise unremarkable examination of the thorax. Electronically Signed   By: Helyn Numbers MD   On: 09/13/2020 23:48     ____________________________________________  PROCEDURES   Procedure(s) performed (including Critical Care):  Procedures  ____________________________________________  INITIAL IMPRESSION / MDM / ASSESSMENT AND PLAN / ED COURSE  As part of my medical decision making, I reviewed the following data within the electronic MEDICAL RECORD NUMBER Nursing notes reviewed and incorporated, Old chart reviewed, Notes from prior ED visits, and [place] Controlled Substance Database       *Tara [name] was evaluated in Emergency Department on 09/14/2020 for the symptoms described in the history of present illness. She was evaluated in the context of the global COVID-19 pandemic, which necessitated consideration that the patient might be at risk for infection with the SARS-CoV-2 virus that causes COVID-19. Institutional protocols and algorithms that pertain to the evaluation of patients at risk for COVID-19 are in a state of rapid change based on information released by regulatory bodies including the CDC and federal and state organizations. These policies and algorithms were followed during the patient's care in the ED.  Some ED evaluations and interventions may be delayed as a result of limited staffing during the pandemic.*     Medical Decision Making: 34 year old female here with positive D-dimer from ED visit several days ago.  Of note, the patient has multiple Covid sick contacts, and has positive Covid test here.  I suspect this is the etiology for her positive D-dimer, but given the height of elevation, will check a CT angio to evaluate for PE. Otherwise, I reviewed her prior labs, mostly unremarkable. She is not hypoxic or in resp distress. CBC without leukocytoss or anemia. CMP unremarkable. UPT neg. UA unremarkable. Plan to f/u CT, likely d/c if negative.  ____________________________________________  FINAL CLINICAL IMPRESSION(S) / ED DIAGNOSES  Final diagnoses:  Positive D-dimer  COVID-19      MEDICATIONS GIVEN DURING THIS VISIT:  Medications  iohexol (OMNIPAQUE) 350 MG/ML injection 75 mL (75 mLs Intravenous Contrast Given 09/13/20 2335)     ED Discharge Orders    None       Note:  This document was prepared using Dragon voice recognition software and may include unintentional dictation errors.   Shaune Pollack, MD 09/14/20 1233

## 2020-09-13 NOTE — ED Triage Notes (Signed)
Pt comes POV after getting a phone call from here stating that her D-Dimer was high. Pt denies any complaints at this time.

## 2020-09-14 NOTE — ED Notes (Signed)
Pt denies complaints. No difficulty breathing, pain, or other discomfort. Pt denies fevers.  Pt ambulatory sat 99%, no increase to work of breathing

## 2020-09-14 NOTE — ED Provider Notes (Signed)
Received care of this patient from Dr. Erma Heritage at 11 PM.  Patient here for a CT angio for an abnormal D-dimer.  Patient denies chest pain or shortness of breath.  She is Covid positive.  CT angio visualized by me with no evidence of pneumonia or PE, confirmed by radiology.  Patient was ambulated in the room and maintain her sats at 100%.  She is completely asymptomatic.  Discussed quarantine, immune system boosting, O2 sat monitoring at home, follow-up with PCP, and standard return precautions.   I have personally reviewed the images performed during this visit and I agree with the Radiologist's read.   Interpretation by Radiologist:  DG Chest 2 View  Result Date: 09/13/2020 CLINICAL DATA:  Chest pain.  Elevated D-dimer EXAM: CHEST - 2 VIEW COMPARISON:  None. FINDINGS: Lungs are clear. Heart size and pulmonary vascularity are normal. No adenopathy. No pneumothorax. No bone lesions. IMPRESSION: Lungs clear.  Cardiac silhouette normal. Electronically Signed   By: Bretta Bang III M.D.   On: 09/13/2020 19:40   CT Angio Chest PE W and/or Wo Contrast  Result Date: 09/13/2020 CLINICAL DATA:  Elevated D-dimer, COVID positive EXAM: CT ANGIOGRAPHY CHEST WITH CONTRAST TECHNIQUE: Multidetector CT imaging of the chest was performed using the standard protocol during bolus administration of intravenous contrast. Multiplanar CT image reconstructions and MIPs were obtained to evaluate the vascular anatomy. CONTRAST:  77mL OMNIPAQUE IOHEXOL 350 MG/ML SOLN COMPARISON:  None. FINDINGS: Cardiovascular: Satisfactory opacification of the pulmonary arteries to the segmental level. No evidence of pulmonary embolism. Normal heart size. No pericardial effusion. The thoracic aorta is unremarkable. Mediastinum/Nodes: Thyroid unremarkable. No pathologic thoracic adenopathy. Tiny hiatal hernia. Lungs/Pleura: Lungs are clear. No pleural effusion or pneumothorax. Upper Abdomen: No acute abnormality. Musculoskeletal: No chest  wall abnormality. No acute or significant osseous findings. Review of the MIP images confirms the above findings. IMPRESSION: No pulmonary embolism. Tiny hiatal hernia. Otherwise unremarkable examination of the thorax. Electronically Signed   By: Helyn Numbers MD   On: 09/13/2020 23:48      Nita Sickle, MD 09/14/20 0005

## 2020-09-18 ENCOUNTER — Other Ambulatory Visit

## 2020-09-18 ENCOUNTER — Other Ambulatory Visit: Payer: Self-pay

## 2020-09-18 DIAGNOSIS — Z20822 Contact with and (suspected) exposure to covid-19: Secondary | ICD-10-CM

## 2020-09-19 LAB — NOVEL CORONAVIRUS, NAA: SARS-CoV-2, NAA: NOT DETECTED

## 2020-09-19 LAB — SARS-COV-2, NAA 2 DAY TAT

## 2022-04-09 IMAGING — CT CT ANGIO CHEST
2 of 6 series · 18 of 46 positions shown · IV contrast (omnipaque)
Comparison: None.

CLINICAL DATA: Elevated D-dimer, COVID positive

EXAM:
CT ANGIOGRAPHY CHEST WITH CONTRAST
TECHNIQUE: Multidetector CT imaging of the chest was performed using the
standard protocol during bolus administration of intravenous
contrast. Multiplanar CT image reconstructions and MIPs were
obtained to evaluate the vascular anatomy.
CONTRAST:  75mL OMNIPAQUE IOHEXOL 350 MG/ML SOLN

[Series 5: thins · axial · 0.62mm/px · z∈[-247,-58]mm · 15 of 209 slices shown]
[im 10/209  lung]
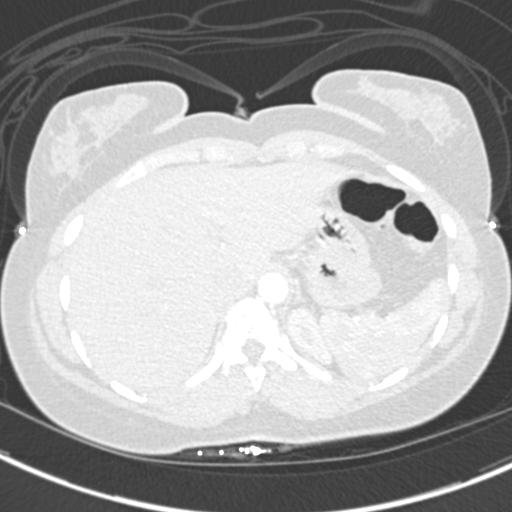
[im 28/209  soft-tissue]
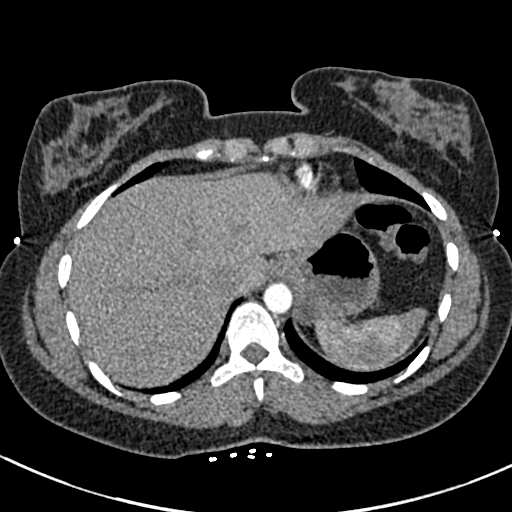
[im 37/209  lung]
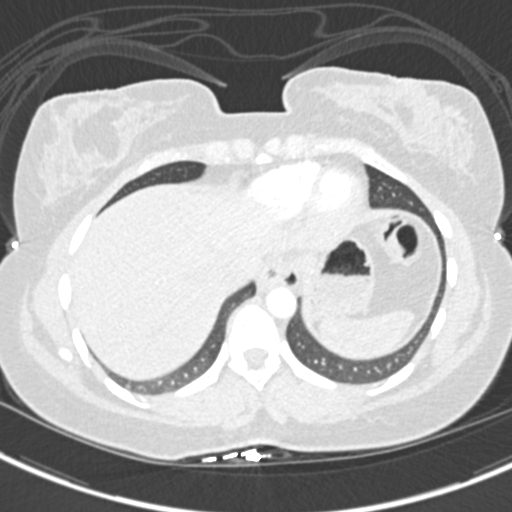
[im 55/209  soft-tissue]
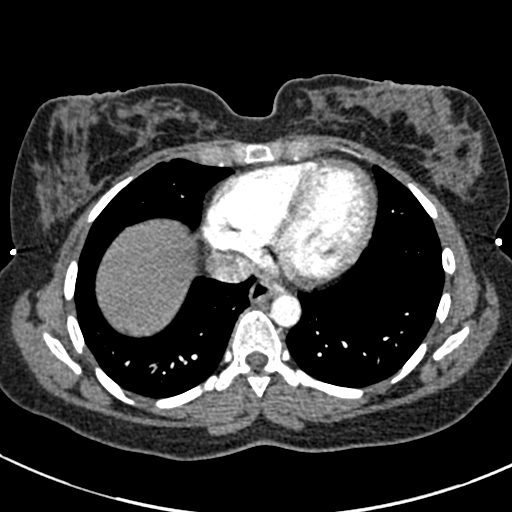
[im 64/209  lung]
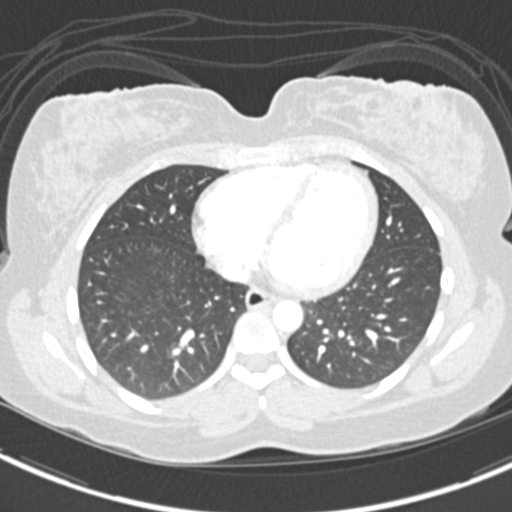
[im 82/209  soft-tissue]
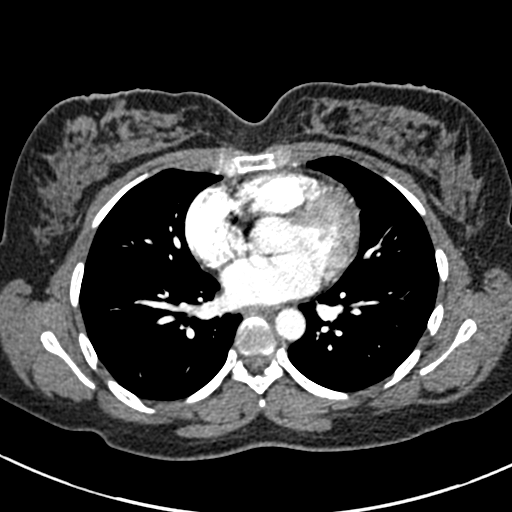
[im 91/209  lung]
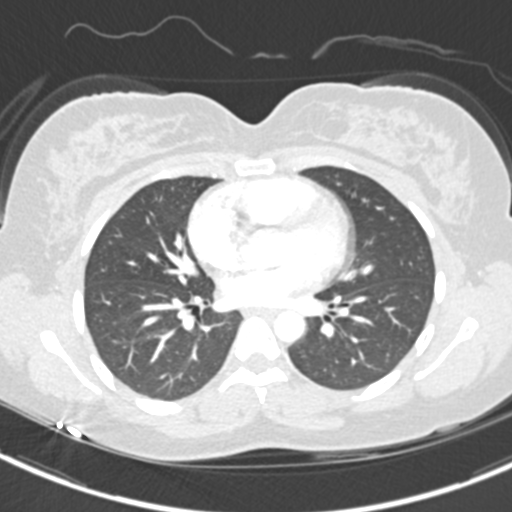
[im 109/209  soft-tissue]
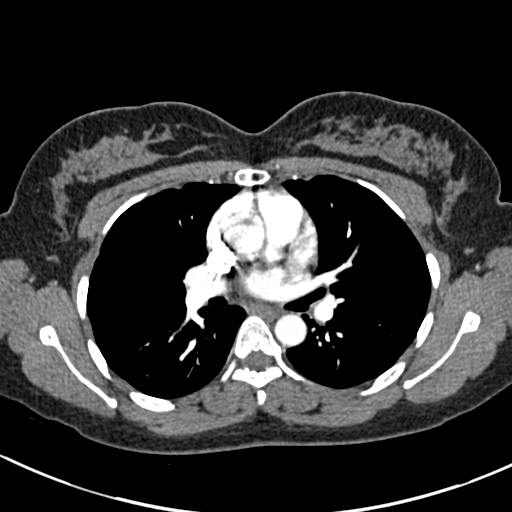
[im 118/209  lung]
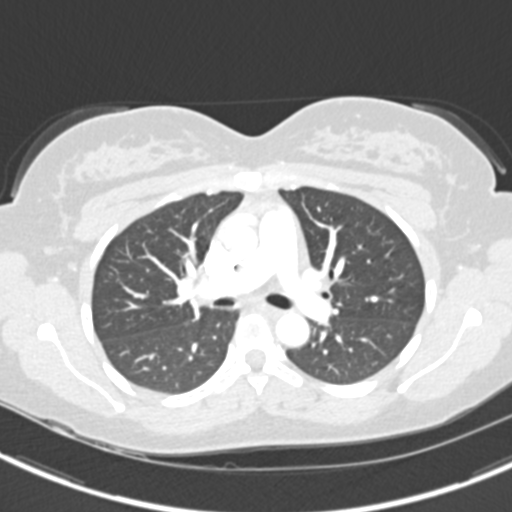
[im 127/209  soft-tissue]
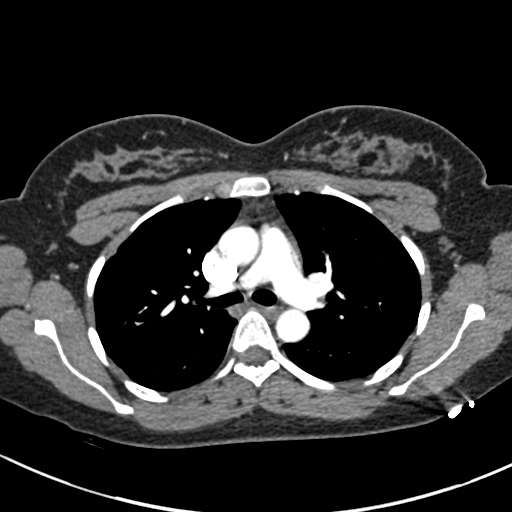
[im 145/209  lung]
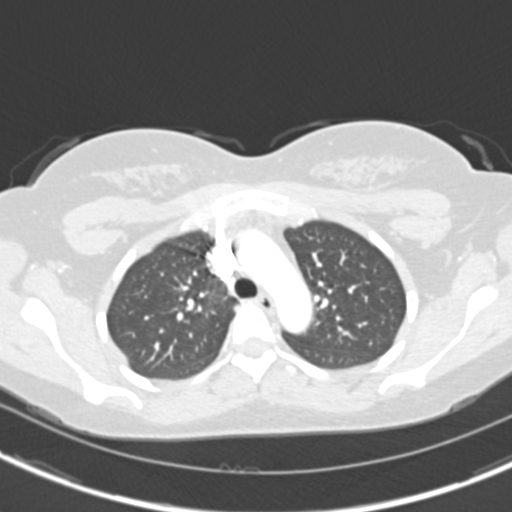
[im 154/209  soft-tissue]
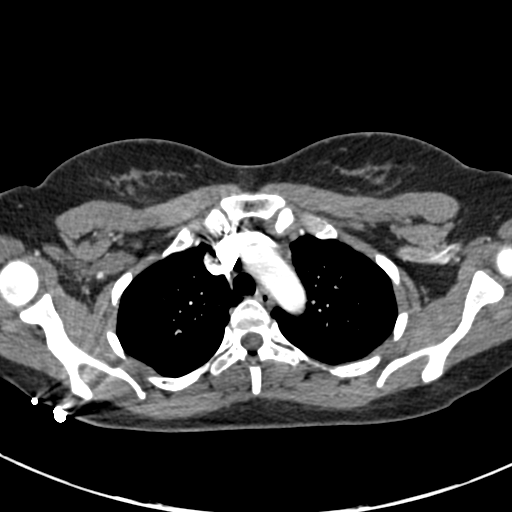
[im 172/209  lung]
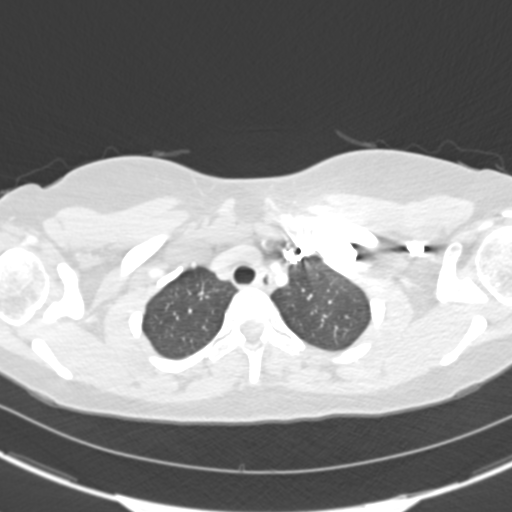
[im 181/209  soft-tissue]
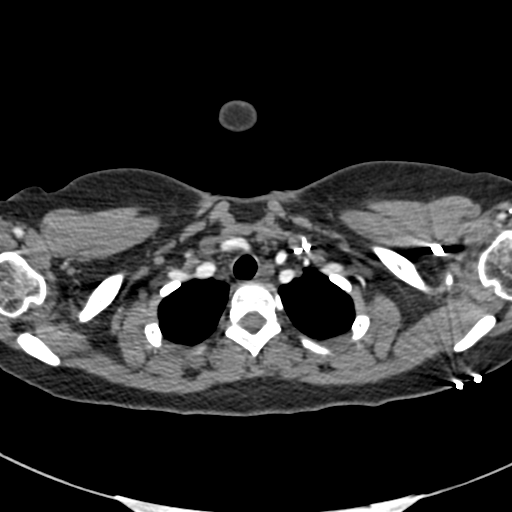
[im 199/209  lung]
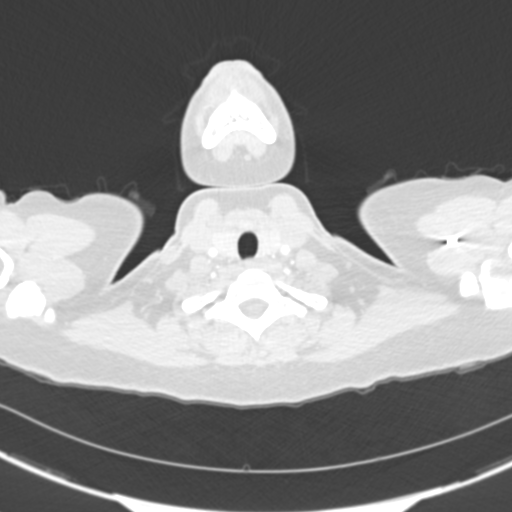

[Series 7: coronal mpr · coronal · 0.44mm/px · 3 of 69 slices shown]
[im 18/69  soft-tissue]
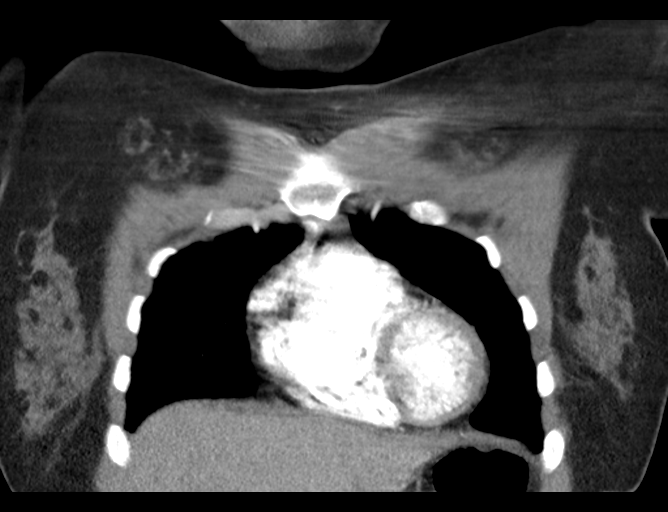
[im 35/69  soft-tissue]
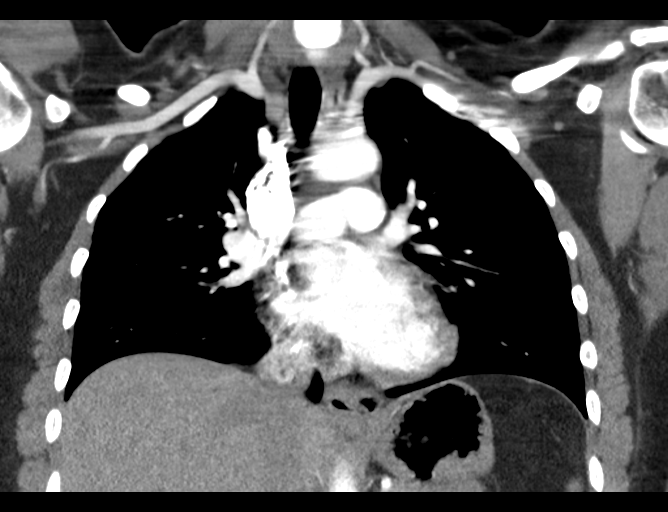
[im 52/69  soft-tissue]
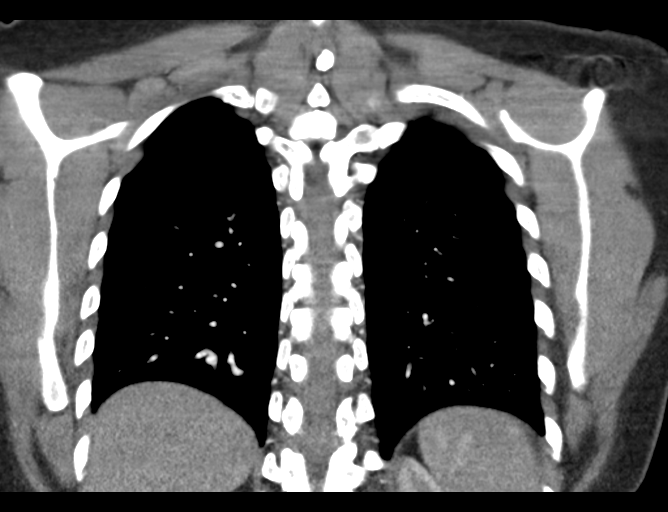

[18 of 46 positions shown; findings below may reference images not displayed]

FINDINGS: Cardiovascular: Satisfactory opacification of the pulmonary arteries
to the segmental level. No evidence of pulmonary embolism. Normal
heart size. No pericardial effusion. The thoracic aorta is
unremarkable.

Mediastinum/Nodes: Thyroid unremarkable. No pathologic thoracic
adenopathy. Tiny hiatal hernia.

Lungs/Pleura: Lungs are clear. No pleural effusion or pneumothorax.

Upper Abdomen: No acute abnormality.

Musculoskeletal: No chest wall abnormality. No acute or significant
osseous findings.

Review of the MIP images confirms the above findings.
IMPRESSION: No pulmonary embolism.

Tiny hiatal hernia. Otherwise unremarkable examination of the
thorax.

## 2022-05-23 ENCOUNTER — Ambulatory Visit (INDEPENDENT_AMBULATORY_CARE_PROVIDER_SITE_OTHER): Admitting: Family Medicine

## 2022-05-23 VITALS — BP 110/78 | HR 90 | Temp 97.8°F | Resp 18 | Ht 63.5 in | Wt 121.0 lb

## 2022-05-23 DIAGNOSIS — F32A Depression, unspecified: Secondary | ICD-10-CM | POA: Diagnosis not present

## 2022-05-23 DIAGNOSIS — Z630 Problems in relationship with spouse or partner: Secondary | ICD-10-CM | POA: Diagnosis not present

## 2022-05-23 DIAGNOSIS — R634 Abnormal weight loss: Secondary | ICD-10-CM

## 2022-05-23 DIAGNOSIS — F419 Anxiety disorder, unspecified: Secondary | ICD-10-CM

## 2022-05-23 LAB — COMPREHENSIVE METABOLIC PANEL
ALT: 9 U/L (ref 0–35)
AST: 13 U/L (ref 0–37)
Albumin: 4.3 g/dL (ref 3.5–5.2)
Alkaline Phosphatase: 76 U/L (ref 39–117)
BUN: 11 mg/dL (ref 6–23)
CO2: 28 mEq/L (ref 19–32)
Calcium: 9.2 mg/dL (ref 8.4–10.5)
Chloride: 105 mEq/L (ref 96–112)
Creatinine, Ser: 0.65 mg/dL (ref 0.40–1.20)
GFR: 115.58 mL/min (ref >60.00)
Glucose, Bld: 89 mg/dL (ref 70–99)
Potassium: 4 mEq/L (ref 3.5–5.1)
Sodium: 139 mEq/L (ref 135–145)
Total Bilirubin: 0.7 mg/dL (ref 0.2–1.2)
Total Protein: 7 g/dL (ref 6.0–8.3)

## 2022-05-23 LAB — CBC
HCT: 38.3 % (ref 36.0–46.0)
Hemoglobin: 12.8 g/dL (ref 12.0–15.0)
MCHC: 33.3 g/dL (ref 30.0–36.0)
MCV: 95.7 fl (ref 78.0–100.0)
Platelets: 386 10*3/uL (ref 150.0–400.0)
RBC: 4.01 Mil/uL (ref 3.87–5.11)
RDW: 12.9 % (ref 11.5–15.5)
WBC: 3.4 10*3/uL — ABNORMAL LOW (ref 4.0–10.5)

## 2022-05-23 LAB — TSH: TSH: 1.18 u[IU]/mL (ref 0.35–5.50)

## 2022-05-23 NOTE — Patient Instructions (Signed)
[facility] - Urgent care services - Outpatient services: Individual Therapy Partial Hospitalization Program (PHP) Substance Abuse Intensive Outpatient Program (SAIOP) Specialized Intensive Adult Group Therapy Medication Management Peer Living Room  Phone: [phone]  Address: [address] [place], [place] [address]  Hours: Open 24/7, No appointment required.  

## 2022-05-23 NOTE — Progress Notes (Unsigned)
New Patient Office Visit  Subjective    Patient ID: Tara [name], female    DOB: [birth year]  Age: 36 y.o. MRN: [id number]  CC:  Chief Complaint  Patient presents with   New Patient (Initial Visit)    Concerns/ questions: pt would like to discuss her mental health.      HPI Tara [name] presents to establish care. She has not had a PCP in 5+ years. No daily medications or chronic diagnosis.  ***     Mental health concerns: Going thru a divorce, 6 kids, had to sell house, nowhere to live right now, life has come crashing down all at one time and is  Staying with mom in a 2 bedroom trailer w 6 kids, looking for somewhere to leave Was in therapy before for marital issues, but stopped going because she felt like they kept making her relive her past when current issues where her primary problem.  She has never been on medication for depression/anxiety Usually good at coping, but right now there is so many different things going on  She doesn't want to water down her feelings and be numb, but now she is not eating, losing weight No thoughts of hurting herself or others, just constantly trying to figure out how to fix her situation Not sleeping, not eating She feels safe physically 180 pounds down to 120 pounds - didn't eat for about 6 -8 months because food made her nauseous - when stressed she doesn't eat  Tearful often Work - [employer], constantly around people Felt like she was coping and managing until last week when spouse wanted to sell the house and left her to clean it out all out by herself while working and caring for the kids (79-30 years old) She doesn't have any help in any area of life right now    Called [employer] at work She has tried medication in the past (verbal abuse) - meds (Lexapro) made her feel stuck and she felt like she couldn't do what she needed to do to take care of her family         Outpatient Encounter Medications as of 05/23/2022   Medication Sig   acetaminophen (TYLENOL) 325 MG tablet Take 650 mg by mouth every 6 (six) hours as needed. (Patient not taking: Reported on 05/23/2022)   ibuprofen (ADVIL,MOTRIN) 600 MG tablet Take 1 tablet (600 mg total) by mouth every 6 (six) hours as needed. (Patient not taking: Reported on 10/20/2018)   oxyCODONE-acetaminophen (PERCOCET/ROXICET) 5-325 MG tablet Take 1-2 tablets by mouth every 6 (six) hours as needed. (Patient not taking: Reported on 10/20/2018)   No facility-administered encounter medications on file as of 05/23/2022.    Past Medical History:  Diagnosis Date   Menorrhagia with regular cycle 12/17/2016   Vaginal Pap smear, abnormal     Past Surgical History:  Procedure Laterality Date   COLPOSCOPY     LAPAROSCOPIC BILATERAL SALPINGECTOMY Bilateral 09/18/2018   Procedure: LAPAROSCOPIC BILATERAL SALPINGECTOMY;  Surgeon: Reva Bores, MD;  Location: [facility];  Service: Gynecology;  Laterality: Bilateral;    Family History  Problem Relation Age of Onset   Hypertension Mother    Arthritis Maternal Grandmother    Alcohol abuse Neg Hx    Asthma Neg Hx    Birth defects Neg Hx    Cancer Neg Hx    COPD Neg Hx    Diabetes Neg Hx    Depression Neg Hx    Drug abuse  Neg Hx    Early death Neg Hx    Hearing loss Neg Hx    Heart disease Neg Hx    Hyperlipidemia Neg Hx    Kidney disease Neg Hx    Learning disabilities Neg Hx    Mental illness Neg Hx    Mental retardation Neg Hx    Miscarriages / Stillbirths Neg Hx    Stroke Neg Hx    Vision loss Neg Hx    Varicose Veins Neg Hx     Social History   Socioeconomic History   Marital status: Married    Spouse name: Not on file   Number of children: Not on file   Years of education: Not on file   Highest education level: Not on file  Occupational History   Not on file  Tobacco Use   Smoking status: Never   Smokeless tobacco: Never  Vaping Use   Vaping Use: Never used  Substance and Sexual  Activity   Alcohol use: No    Alcohol/week: 0.0 standard drinks of alcohol   Drug use: Never   Sexual activity: Yes    Birth control/protection: None  Other Topics Concern   Not on file  Social History Narrative   Not on file   Social Determinants of Health   Financial Resource Strain: Low Risk  (07/13/2018)   Overall Financial Resource Strain (CARDIA)    Difficulty of Paying Living Expenses: Not hard at all  Food Insecurity: No Food Insecurity (07/13/2018)   Hunger Vital Sign    Worried About Running Out of Food in the Last Year: Never true    Ran Out of Food in the Last Year: Never true  Transportation Needs: No Transportation Needs (07/13/2018)   PRAPARE - Administrator, Civil Service (Medical): No    Lack of Transportation (Non-Medical): No  Physical Activity: Not on file  Stress: Not on file  Social Connections: Not on file  Intimate Partner Violence: Not At Risk (07/13/2018)   Humiliation, Afraid, Rape, and Kick questionnaire    Fear of Current or Ex-Partner: No    Emotionally Abused: No    Physically Abused: No    Sexually Abused: No    ROS All review of systems negative except what is listed in the HPI      Objective    BP 110/78 (BP Location: Left Arm, Patient Position: Sitting, Cuff Size: Normal)   Pulse 90   Temp 97.8 F (36.6 C) (Oral)   Resp 18   Ht 5' 3.5" (1.613 m)   Wt 121 lb (54.9 kg)   SpO2 99%   BMI 21.10 kg/m   Physical Exam Vitals reviewed.  Constitutional:      General: She is not in acute distress.    Appearance: Normal appearance. She is not ill-appearing.  Cardiovascular:     Rate and Rhythm: Normal rate and regular rhythm.  Pulmonary:     Effort: Pulmonary effort is normal.     Breath sounds: Normal breath sounds.  Musculoskeletal:     Cervical back: Normal range of motion and neck supple.  Skin:    General: Skin is warm and dry.  Neurological:     General: No focal deficit present.     Mental Status: She is alert  and oriented to person, place, and time. Mental status is at baseline.  Psychiatric:        Behavior: Behavior normal.        Thought Content: Thought content  normal.        Judgment: Judgment normal.     Comments: tearful     {Labs (Optional):23779}    Assessment & Plan:   Problem List Items Addressed This Visit       Other   Anxiety and depression    No SI/HI. She declines starting meds today but is willing to place psych referral and start counseling. FMLA filled out for the week she missed of work and intermittent for flares and upcoming appointments for counseling, follow-up, etc. Discussed resources. She is being very proactive and has started looking for options for housing. Safety discussed. Patient aware of signs/symptoms requiring further/urgent evaluation.       Relevant Orders   Ambulatory referral to Behavioral Health   Other Visit Diagnoses     Unintentional weight loss    -  Primary Likely stress/anxiety/depression related, but checking labs to rule out other causes.    Relevant Orders   CBC   Comprehensive metabolic panel   TSH   Stress due to marital problems     She has removed herself from the situation and is living with mom and her 6 kids. She feels safer staying with her than going to a shelter. Denies any physical harm/threats currently. She is aware of safety and is looking into resources. 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 See above for management - referring for counseling.    Relevant Orders   Ambulatory referral to Behavioral Health       Return in about 4 weeks (around 06/20/2022) for mood f/u .   Clayborne Dana, NP

## 2022-05-23 NOTE — Assessment & Plan Note (Signed)
No SI/HI. She declines starting meds today but is willing to place psych referral and start counseling. FMLA filled out for the week she missed of work and intermittent for flares and upcoming appointments for counseling, follow-up, etc. Discussed resources. She is being very proactive and has started looking for options for housing. Safety discussed. Patient aware of signs/symptoms requiring further/urgent evaluation.

## 2022-05-24 ENCOUNTER — Encounter: Payer: Self-pay | Admitting: Family Medicine

## 2022-05-29 ENCOUNTER — Ambulatory Visit: Admitting: Family Medicine

## 2022-06-19 ENCOUNTER — Ambulatory Visit: Admitting: Family Medicine

## 2022-12-09 ENCOUNTER — Telehealth: Payer: Self-pay | Admitting: Family Medicine

## 2022-12-09 NOTE — Telephone Encounter (Signed)
Forms were faxed into front office Placed in TB bin up front

## 2022-12-13 NOTE — Telephone Encounter (Signed)
Spoke with pt and doesn't need paperwork filled out

## 2023-05-03 DIAGNOSIS — Z419 Encounter for procedure for purposes other than remedying health state, unspecified: Secondary | ICD-10-CM | POA: Diagnosis not present

## 2023-06-02 DIAGNOSIS — Z419 Encounter for procedure for purposes other than remedying health state, unspecified: Secondary | ICD-10-CM | POA: Diagnosis not present

## 2023-07-03 DIAGNOSIS — Z419 Encounter for procedure for purposes other than remedying health state, unspecified: Secondary | ICD-10-CM | POA: Diagnosis not present

## 2023-08-03 DIAGNOSIS — Z419 Encounter for procedure for purposes other than remedying health state, unspecified: Secondary | ICD-10-CM | POA: Diagnosis not present

## 2023-08-21 ENCOUNTER — Ambulatory Visit (INDEPENDENT_AMBULATORY_CARE_PROVIDER_SITE_OTHER): Admitting: Obstetrics and Gynecology

## 2023-08-21 ENCOUNTER — Encounter: Payer: Self-pay | Admitting: Obstetrics and Gynecology

## 2023-08-21 ENCOUNTER — Other Ambulatory Visit (HOSPITAL_COMMUNITY)
Admission: RE | Admit: 2023-08-21 | Discharge: 2023-08-21 | Disposition: A | Discharge status: Home / Self Care | Source: Ambulatory Visit | Attending: Obstetrics and Gynecology | Admitting: Obstetrics and Gynecology

## 2023-08-21 VITALS — BP 109/75 | HR 76 | Wt 149.0 lb

## 2023-08-21 DIAGNOSIS — Z202 Contact with and (suspected) exposure to infections with a predominantly sexual mode of transmission: Secondary | ICD-10-CM | POA: Diagnosis not present

## 2023-08-21 DIAGNOSIS — N939 Abnormal uterine and vaginal bleeding, unspecified: Secondary | ICD-10-CM | POA: Diagnosis not present

## 2023-08-21 DIAGNOSIS — N920 Excessive and frequent menstruation with regular cycle: Secondary | ICD-10-CM

## 2023-08-21 DIAGNOSIS — Z1339 Encounter for screening examination for other mental health and behavioral disorders: Secondary | ICD-10-CM

## 2023-08-21 DIAGNOSIS — Z01419 Encounter for gynecological examination (general) (routine) without abnormal findings: Secondary | ICD-10-CM | POA: Diagnosis not present

## 2023-08-21 DIAGNOSIS — N898 Other specified noninflammatory disorders of vagina: Secondary | ICD-10-CM

## 2023-08-21 DIAGNOSIS — Z124 Encounter for screening for malignant neoplasm of cervix: Secondary | ICD-10-CM | POA: Insufficient documentation

## 2023-08-21 DIAGNOSIS — B3731 Acute candidiasis of vulva and vagina: Secondary | ICD-10-CM

## 2023-08-21 DIAGNOSIS — N946 Dysmenorrhea, unspecified: Secondary | ICD-10-CM

## 2023-08-21 MED ORDER — TRANEXAMIC ACID 650 MG PO TABS: 1300.0000 mg | ORAL_TABLET | Freq: Three times a day (TID) | ORAL | 2 refills | Status: DC

## 2023-08-21 MED ORDER — FLUCONAZOLE 150 MG PO TABS: 150.0000 mg | ORAL_TABLET | ORAL | 0 refills | Status: DC

## 2023-08-21 NOTE — Progress Notes (Signed)
Patient presents for Annual.  LMP: 07/26/23 Monthly would like discuss contraception to stop cycle.pt states cycle are very heavy. Last pap:  12/29/2017 ASCUS  Contraception: None Mammogram: Not yet indicated STD Screening: Accepts   CC:  possible yeast infection.   Fun Fact: Pt used to love to sing.

## 2023-08-22 LAB — CERVICOVAGINAL ANCILLARY ONLY
Bacterial Vaginitis (gardnerella): POSITIVE — AB
Candida Glabrata: NEGATIVE
Candida Vaginitis: POSITIVE — AB
Chlamydia: NEGATIVE
Comment: NEGATIVE
Comment: NEGATIVE
Comment: NEGATIVE
Comment: NEGATIVE
Comment: NEGATIVE
Comment: NORMAL
Neisseria Gonorrhea: NEGATIVE
Trichomonas: NEGATIVE

## 2023-08-22 LAB — CBC
Hematocrit: 42.3 % (ref 34.0–46.6)
Hemoglobin: 13.8 g/dL (ref 11.1–15.9)
MCH: 31 pg (ref 26.6–33.0)
MCHC: 32.6 g/dL (ref 31.5–35.7)
MCV: 95 fL (ref 79–97)
Platelets: 359 10*3/uL (ref 150–450)
RBC: 4.45 x10E6/uL (ref 3.77–5.28)
RDW: 12.2 % (ref 11.7–15.4)
WBC: 3 10*3/uL — ABNORMAL LOW (ref 3.4–10.8)

## 2023-08-22 LAB — RPR+HBSAG+HCVAB+...
HIV Screen 4th Generation wRfx: NONREACTIVE
Hep C Virus Ab: NONREACTIVE
Hepatitis B Surface Ag: NEGATIVE
RPR Ser Ql: NONREACTIVE

## 2023-08-23 ENCOUNTER — Encounter: Payer: Self-pay | Admitting: Obstetrics and Gynecology

## 2023-08-23 DIAGNOSIS — D72819 Decreased white blood cell count, unspecified: Secondary | ICD-10-CM | POA: Insufficient documentation

## 2023-08-23 MED ORDER — METRONIDAZOLE 500 MG PO TABS: 500.0000 mg | ORAL_TABLET | Freq: Two times a day (BID) | ORAL | 0 refills | Status: DC

## 2023-08-26 ENCOUNTER — Encounter: Payer: Self-pay | Admitting: Obstetrics and Gynecology

## 2023-08-26 LAB — CYTOLOGY - PAP
Comment: NEGATIVE
Diagnosis: NEGATIVE
High risk HPV: NEGATIVE

## 2023-08-26 NOTE — Progress Notes (Signed)
Obstetrics and Gynecology New Patient Evaluation  Appointment Date: 08/21/2023  OBGYN Clinic: [facility]  Primary Care Provider: Hyman Hopes B  Referring Provider: Clayborne Dana, NP  Chief Complaint:  Chief Complaint  Patient presents with   Gynecologic Exam    History of Present Illness: Tara [name] is a 37 y.o.  [id number] (Patient's last menstrual period was 07/26/2023.), seen for the above chief complaint. Her past medical history is significant for h/o BTL  Patient with qmonth regular, heavy and painful periods.    Review of Systems: Pertinent items noted in HPI and remainder of comprehensive ROS otherwise negative.    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    Past Medical History:  Past Medical History:  Diagnosis Date   Menorrhagia with regular cycle 12/17/2016   Vaginal Pap smear, abnormal     Past Surgical History:  Past Surgical History:  Procedure Laterality Date   COLPOSCOPY     LAPAROSCOPIC BILATERAL SALPINGECTOMY Bilateral 09/18/2018   Procedure: LAPAROSCOPIC BILATERAL SALPINGECTOMY;  Surgeon: Reva Bores, MD;  Location: [facility];  Service: Gynecology;  Laterality: Bilateral;    Past Obstetrical History:  OB History  Gravida Para Term Preterm AB Living  5 5 5  0 0 5  SAB IAB Ectopic Multiple Live Births  0 0 0 1 4    # Outcome Date GA Lbr Len/2nd Weight Sex Type Anes PTL Lv  5A Term 07/2018          5B Term 07/2018          4 Term 05/13/14 [redacted]w[redacted]d / 00:06 8 lb 3.6 oz (3.731 kg) M Vag-Spont EPI  LIV  3 Term 2013 [redacted]w[redacted]d  7 lb (3.175 kg) M Vag-Spont   LIV  2 Term 09/04/09 [redacted]w[redacted]d  6 lb 15 oz (3.147 kg) M Vag-Spont EPI  LIV  1 Term 08/04/08 [redacted]w[redacted]d  6 lb 15 oz (3.147 kg) F Vag-Spont EPI  LIV    Obstetric Comments  Twins  07/2018     Past Gynecological History: As per HPI. History of Pap Smear(s): Yes.   Last pap 12/2017, ascus/hpv neg  Social History:  Social History   Socioeconomic History   Marital status: Married    Spouse name: Not on file   Number of children: Not on file   Years of education: Not on file   Highest education level: Not on file  Occupational History   Not on file  Tobacco Use   Smoking status: Never   Smokeless tobacco: Never  Vaping Use   Vaping status: Never Used  Substance and Sexual Activity   Alcohol use: No    Alcohol/week: 0.0 standard drinks of alcohol   Drug use: Never   Sexual activity: Yes    Partners: Male    Birth control/protection: None  Other Topics Concern   Not on file  Social History Narrative   Not on file   Social Determinants of Health   Financial Resource Strain: Low Risk  (07/13/2018)   Overall Financial Resource Strain (CARDIA)    Difficulty of Paying Living Expenses: Not hard at all  Food Insecurity: No Food Insecurity (07/13/2018)   Hunger Vital Sign    Worried About Running Out of Food in the Last Year: Never true  Ran Out of Food in the Last Year: Never true  Transportation Needs: No Transportation Needs (07/13/2018)   PRAPARE - Administrator, Civil Service (Medical): No    Lack of Transportation (Non-Medical): No  Physical Activity: Not on file  Stress: Not on file  Social Connections: Not on file  Intimate Partner Violence: Not At Risk (07/13/2018)   Humiliation, Afraid, Rape, and Kick questionnaire    Fear of Current or Ex-Partner: No    Emotionally Abused: No    Physically Abused: No    Sexually Abused: No    Family History:  Family History  Problem Relation Age of Onset   Hypertension Mother    Arthritis Maternal Grandmother    Alcohol abuse Neg Hx    Asthma Neg Hx    Birth defects Neg Hx    Cancer Neg Hx    COPD Neg Hx    Diabetes Neg Hx    Depression Neg Hx    Drug abuse Neg Hx    Early death Neg  Hx    Hearing loss Neg Hx    Heart disease Neg Hx    Hyperlipidemia Neg Hx    Kidney disease Neg Hx    Learning disabilities Neg Hx    Mental illness Neg Hx    Mental retardation Neg Hx    Miscarriages / Stillbirths Neg Hx    Stroke Neg Hx    Vision loss Neg Hx    Varicose Veins Neg Hx     Medications Lexiana Aitken had no medications administered during this visit. Current Outpatient Medications  Medication Sig Dispense Refill   acetaminophen (TYLENOL) 325 MG tablet Take 650 mg by mouth every 6 (six) hours as needed. (Patient not taking: Reported on 08/21/2023)     ibuprofen (ADVIL,MOTRIN) 600 MG tablet Take 1 tablet (600 mg total) by mouth every 6 (six) hours as needed. (Patient not taking: Reported on 08/21/2023) 30 tablet 1   oxyCODONE-acetaminophen (PERCOCET/ROXICET) 5-325 MG tablet Take 1-2 tablets by mouth every 6 (six) hours as needed. (Patient not taking: Reported on 08/21/2023) 10 tablet 0   No current facility-administered medications for this visit.    Allergies Patient has no known allergies.   Physical Exam:  BP 109/75   Pulse 76   Wt 149 lb (67.6 kg)   LMP 07/26/2023   BMI 25.98 kg/m  Body mass index is 25.98 kg/m. General appearance: Well nourished, well developed female in no acute distress.  Neck:  Supple, normal appearance, and no thyromegaly  Cardiovascular: normal s1 and s2.  No murmurs, rubs or gallops. Respiratory:  Clear to auscultation bilateral. Normal respiratory effort Abdomen: positive bowel sounds and no masses, hernias; diffusely non tender to palpation, non distended. Neuro/Psych:  Normal mood and affect.  Skin:  Warm and dry.  Lymphatic:  No inguinal lymphadenopathy.   Cervical exam performed in the presence of a chaperone Pelvic exam: is not limited by body habitus EGBUS: within normal limits Vagina: +white cottage cheese d/c Cervix: normal appearing cervix without tenderness, discharge or lesions. Uterus:  nonenlarged and non  tender Adnexa:  normal adnexa and no mass, fullness, tenderness Rectovaginal: deferred  Laboratory: none  Radiology: none  Assessment: patient   Plan: 1. Cervical cancer screening - Cytology - PAP( [place])  2. STD exposure - Cervicovaginal ancillary only( [place]) - RPR+HBsAg+HCVAb+...  3. Vaginal discharge - Cervicovaginal ancillary only( [place])  4. Abnormal uterine bleeding (AUB) F/u after u/s. Pt amenable [facility]  trial in the interim and NSAIDs - CBC - US PELVIC COMPLETE WITH TRANSVAGINAL; Future  5. Vulvovaginal candidiasis Diflucan sent in   6. Dysmenorrhea  7. Menorrhagia with regular cycle  Orders Placed This Encounter  Procedures   US PELVIC COMPLETE WITH TRANSVAGINAL   RPR+HBsAg+HCVAb+...   CBC    RTC after u/s  No follow-ups on file.  Future Appointments  Date Time Provider Department Center  08/28/2023  2:30 PM [facility]  09/05/2023  3:20 PM Bethanie Dicker, NP [facility]  09/17/2023  3:10 PM [place], MD [facility]    Cornelia Copa MD Attending [facility])

## 2023-08-28 ENCOUNTER — Ambulatory Visit (HOSPITAL_COMMUNITY): Payer: Medicaid Other

## 2023-09-02 DIAGNOSIS — Z419 Encounter for procedure for purposes other than remedying health state, unspecified: Secondary | ICD-10-CM | POA: Diagnosis not present

## 2023-09-04 ENCOUNTER — Ambulatory Visit
Admission: RE | Admit: 2023-09-04 | Discharge: 2023-09-04 | Disposition: A | Discharge status: Home / Self Care | Source: Ambulatory Visit | Attending: Obstetrics and Gynecology | Admitting: Obstetrics and Gynecology

## 2023-09-04 DIAGNOSIS — N939 Abnormal uterine and vaginal bleeding, unspecified: Secondary | ICD-10-CM | POA: Insufficient documentation

## 2023-09-05 ENCOUNTER — Ambulatory Visit: Admitting: Nurse Practitioner

## 2023-09-17 ENCOUNTER — Encounter: Payer: Self-pay | Admitting: Obstetrics and Gynecology

## 2023-09-17 ENCOUNTER — Ambulatory Visit: Admitting: Obstetrics and Gynecology

## 2023-09-19 NOTE — Progress Notes (Signed)
Patient did not keep her follow up GYN appointment for 09/17/2023.  Cornelia Copa MD Attending [facility])

## 2023-10-03 DIAGNOSIS — Z419 Encounter for procedure for purposes other than remedying health state, unspecified: Secondary | ICD-10-CM | POA: Diagnosis not present

## 2023-11-02 DIAGNOSIS — Z419 Encounter for procedure for purposes other than remedying health state, unspecified: Secondary | ICD-10-CM | POA: Diagnosis not present

## 2023-12-03 DIAGNOSIS — Z419 Encounter for procedure for purposes other than remedying health state, unspecified: Secondary | ICD-10-CM | POA: Diagnosis not present

## 2023-12-24 ENCOUNTER — Encounter: Admitting: Family Medicine

## 2023-12-24 DIAGNOSIS — Z0000 Encounter for general adult medical examination without abnormal findings: Secondary | ICD-10-CM

## 2024-01-03 DIAGNOSIS — Z419 Encounter for procedure for purposes other than remedying health state, unspecified: Secondary | ICD-10-CM | POA: Diagnosis not present

## 2024-01-12 ENCOUNTER — Other Ambulatory Visit: Payer: Self-pay | Admitting: *Deleted

## 2024-01-12 MED ORDER — METRONIDAZOLE 500 MG PO TABS: 500.0000 mg | ORAL_TABLET | Freq: Two times a day (BID) | ORAL | 0 refills | Status: DC

## 2024-01-12 MED ORDER — FLUCONAZOLE 150 MG PO TABS: 150.0000 mg | ORAL_TABLET | ORAL | 0 refills | Status: DC

## 2024-01-24 ENCOUNTER — Telehealth: Admitting: Physician Assistant

## 2024-01-24 DIAGNOSIS — K0889 Other specified disorders of teeth and supporting structures: Secondary | ICD-10-CM

## 2024-01-24 MED ORDER — AMOXICILLIN-POT CLAVULANATE 875-125 MG PO TABS: 1.0000 | ORAL_TABLET | Freq: Two times a day (BID) | ORAL | 0 refills | Status: AC

## 2024-01-24 MED ORDER — NAPROXEN 500 MG PO TABS: 500.0000 mg | ORAL_TABLET | Freq: Two times a day (BID) | ORAL | 0 refills | Status: AC

## 2024-01-24 NOTE — Progress Notes (Signed)
 I have spent 5 minutes in review of e-visit questionnaire, review and updating patient chart, medical decision making and response to patient.   Laure Kidney, PA-C

## 2024-01-24 NOTE — Progress Notes (Signed)

## 2024-01-31 DIAGNOSIS — Z419 Encounter for procedure for purposes other than remedying health state, unspecified: Secondary | ICD-10-CM | POA: Diagnosis not present

## 2024-02-23 ENCOUNTER — Ambulatory Visit: Admitting: Obstetrics and Gynecology

## 2024-03-13 DIAGNOSIS — Z419 Encounter for procedure for purposes other than remedying health state, unspecified: Secondary | ICD-10-CM | POA: Diagnosis not present

## 2024-04-12 DIAGNOSIS — Z419 Encounter for procedure for purposes other than remedying health state, unspecified: Secondary | ICD-10-CM | POA: Diagnosis not present

## 2024-05-13 DIAGNOSIS — Z419 Encounter for procedure for purposes other than remedying health state, unspecified: Secondary | ICD-10-CM | POA: Diagnosis not present

## 2024-05-22 ENCOUNTER — Other Ambulatory Visit: Payer: Self-pay | Admitting: Obstetrics & Gynecology

## 2024-06-12 DIAGNOSIS — Z419 Encounter for procedure for purposes other than remedying health state, unspecified: Secondary | ICD-10-CM | POA: Diagnosis not present

## 2024-07-13 DIAGNOSIS — Z419 Encounter for procedure for purposes other than remedying health state, unspecified: Secondary | ICD-10-CM | POA: Diagnosis not present

## 2024-08-13 ENCOUNTER — Telehealth: Admitting: Family Medicine

## 2024-08-13 DIAGNOSIS — K0889 Other specified disorders of teeth and supporting structures: Secondary | ICD-10-CM | POA: Diagnosis not present

## 2024-08-13 DIAGNOSIS — Z419 Encounter for procedure for purposes other than remedying health state, unspecified: Secondary | ICD-10-CM | POA: Diagnosis not present

## 2024-08-13 MED ORDER — NAPROXEN 500 MG PO TABS: 500.0000 mg | ORAL_TABLET | Freq: Two times a day (BID) | ORAL | 0 refills | Status: AC

## 2024-08-13 MED ORDER — PENICILLIN V POTASSIUM 500 MG PO TABS: 500.0000 mg | ORAL_TABLET | Freq: Three times a day (TID) | ORAL | 0 refills | Status: AC

## 2024-08-13 NOTE — Progress Notes (Signed)
 E-Visit for Dental Pain  We are sorry that you are not feeling well.  Here is how we plan to help!  Based on what you have shared with me in the questionnaire, it sounds like you have a dental infection due to a broken tooth/cavity.   Pen VK 500mg  3 times a day for 7 days and Naprosyn  500mg  2 times a day for 7 days for discomfort  It is imperative that you see a dentist within 10 days of this eVisit to determine the cause of the dental pain and be sure it is adequately treated  A toothache or tooth pain is caused when the nerve in the root of a tooth or surrounding a tooth is irritated. Dental (tooth) infection, decay, injury, or loss of a tooth are the most common causes of dental pain. Pain may also occur after an extraction (tooth is pulled out). Pain sometimes originates from other areas and radiates to the jaw, thus appearing to be tooth pain.Bacteria growing inside your mouth can contribute to gum disease and dental decay, both of which can cause pain. A toothache occurs from inflammation of the central portion of the tooth called pulp. The pulp contains nerve endings that are very sensitive to pain. Inflammation to the pulp or pulpitis may be caused by dental cavities, trauma, and infection.    HOME CARE:   For toothaches: Over-the-counter pain medications such as acetaminophen  or ibuprofen  may be used. Take these as directed on the package while you arrange for a dental appointment. Avoid very cold or hot foods, because they may make the pain worse. You may get relief from biting on a cotton ball soaked in oil of cloves. You can get oil of cloves at most drug stores.  For jaw pain:  Aspirin  may be helpful for problems in the joint of the jaw in adults. If pain happens every time you open your mouth widely, the temporomandibular joint (TMJ) may be the source of the pain. Yawning or taking a large bite of food may worsen the pain. An appointment with your doctor or dentist will help you  find the cause.     GET HELP RIGHT AWAY IF:  You have a high fever or chills If you have had a recent head or face injury and develop headache, light headedness, nausea, vomiting, or other symptoms that concern you after an injury to your face or mouth, you could have a more serious injury in addition to your dental injury. A facial rash associated with a toothache: This condition may improve with medication. Contact your doctor for them to decide what is appropriate. Any jaw pain occurring with chest pain: Although jaw pain is most commonly caused by dental disease, it is sometimes referred pain from other areas. People with heart disease, especially people who have had stents placed, people with diabetes, or those who have had heart surgery may have jaw pain as a symptom of heart attack or angina. If your jaw or tooth pain is associated with lightheadedness, sweating, or shortness of breath, you should see a doctor as soon as possible. Trouble swallowing or excessive pain or bleeding from gums: If you have a history of a weakened immune system, diabetes, or steroid use, you may be more susceptible to infections. Infections can often be more severe and extensive or caused by unusual organisms. Dental and gum infections in people with these conditions may require more aggressive treatment. An abscess may need draining or IV antibiotics, for example.  MAKE SURE YOU   Understand these instructions. Will watch your condition. Will get help right away if you are not doing well or get worse.  Thank you for choosing an e-visit.  Your e-visit answers were reviewed by a board certified advanced clinical practitioner to complete your personal care plan. Depending upon the condition, your plan could have included both over the counter or prescription medications.  Please review your pharmacy choice. Make sure the pharmacy is open so you can pick up prescription now. If there is a problem, you may contact  your provider through [employer] and have the prescription routed to another pharmacy.  Your safety is important to us . If you have drug allergies check your prescription carefully.   For the next 24 hours you can use MyChart to ask questions about today's visit, request a non-urgent call back, or ask for a work or school excuse. You will get an email in the next two days asking about your experience. I hope that your e-visit has been valuable and will speed your recovery.  I provided 5 minutes of non face-to-face time during this encounter for chart review, medication and order placement, as well as and documentation.

## 2024-08-27 ENCOUNTER — Telehealth: Admitting: Family Medicine

## 2024-08-27 DIAGNOSIS — B3731 Acute candidiasis of vulva and vagina: Secondary | ICD-10-CM | POA: Diagnosis not present

## 2024-08-27 MED ORDER — FLUCONAZOLE 150 MG PO TABS: 150.0000 mg | ORAL_TABLET | ORAL | 0 refills | Status: DC

## 2024-08-27 NOTE — Progress Notes (Signed)

## 2024-11-01 ENCOUNTER — Telehealth

## 2024-11-01 DIAGNOSIS — B3731 Acute candidiasis of vulva and vagina: Secondary | ICD-10-CM

## 2024-11-01 DIAGNOSIS — O23599 Infection of other part of genital tract in pregnancy, unspecified trimester: Secondary | ICD-10-CM

## 2024-11-02 ENCOUNTER — Encounter: Admitting: Physician Assistant

## 2024-11-02 MED ORDER — FLUCONAZOLE 150 MG PO TABS: ORAL_TABLET | ORAL | 0 refills | Status: DC

## 2024-11-02 NOTE — Addendum Note (Signed)
 Addended by: GLADIS ELSIE BROCKS on: 11/02/2024 05:42 PM   Modules accepted: Orders, Level of Service

## 2024-11-02 NOTE — Progress Notes (Addendum)
 Thank you for clarifying your pregnancy status.   We are sorry that you are not feeling well. Here is how we plan to help! Based on what you shared with me it looks like you: May have a yeast vaginosis  Vaginosis is an inflammation of the vagina that can result in discharge, itching and pain. The cause is usually a change in the normal balance of vaginal bacteria or an infection. Vaginosis can also result from reduced estrogen levels after menopause.  The most common causes of vaginosis are:   Bacterial vaginosis which results from an overgrowth of one on several organisms that are normally present in your vagina.   Yeast infections which are caused by a naturally occurring fungus called candida.   Vaginal atrophy (atrophic vaginosis) which results from the thinning of the vagina from reduced estrogen levels after menopause.   Trichomoniasis which is caused by a parasite and is commonly transmitted by sexual intercourse.  Factors that increase your risk of developing vaginosis include: Medications, such as antibiotics and steroids Uncontrolled diabetes Use of hygiene products such as bubble bath, vaginal spray or vaginal deodorant Douching Wearing damp or tight-fitting clothing Using an intrauterine device (IUD) for birth control Hormonal changes, such as those associated with pregnancy, birth control pills or menopause Sexual activity Having a sexually transmitted infection  Your treatment plan is Diflucan  (fluconazole ) 150mg  tablet once, repeating in 3 days  I have electronically sent this prescription into the pharmacy that you have chosen.  Be sure to take all of the medication as directed. Stop taking any medication if you develop a rash, tongue swelling or shortness of breath. Mothers who are breast feeding should consider pumping and discarding their breast milk while on these antibiotics. However, there is no consensus that infant exposure at these doses would be harmful.   Remember that medication creams can weaken latex condoms.   HOME CARE:  Good hygiene may prevent some types of vaginosis from recurring and may relieve some symptoms:  Avoid baths, hot tubs and whirlpool spas. Rinse soap from your outer genital area after a shower, and dry the area well to prevent irritation. Don't use scented or harsh soaps, such as those with deodorant or antibacterial action. Avoid irritants. These include scented tampons and pads. Wipe from front to back after using the toilet. Doing so avoids spreading fecal bacteria to your vagina.  Other things that may help prevent vaginosis include:  Don't douche. Your vagina doesn't require cleansing other than normal bathing. Repetitive douching disrupts the normal organisms that reside in the vagina and can actually increase your risk of vaginal infection. Douching won't clear up a vaginal infection. Use a latex condom. Both female and female latex condoms may help you avoid infections spread by sexual contact. Wear cotton underwear. Also wear pantyhose with a cotton crotch. If you feel comfortable without it, skip wearing underwear to bed. Yeast thrives in [place] Your symptoms should improve in the next day or two.  GET HELP RIGHT AWAY IF:  You have pain in your lower abdomen ( pelvic area or over your ovaries) You develop nausea or vomiting You develop a fever Your discharge changes or worsens You have persistent pain with intercourse You develop shortness of breath, a rapid pulse, or you faint.  These symptoms could be signs of problems or infections that need to be evaluated by a medical provider now.  MAKE SURE YOU   Understand these instructions. Will watch your condition. Will get help right  away if you are not doing well or get worse.  Your e-visit answers were reviewed by a board certified advanced clinical practitioner to complete your personal care plan. Depending upon the condition, your plan  could have included both over the counter or prescription medications. Please review your pharmacy choice to make sure that you have choses a pharmacy that is open for you to pick up any needed prescription, Your safety is important to us . If you have drug allergies check your prescription carefully.   You can use MyChart to ask questions about today's visit, request a non-urgent call back, or ask for a work or school excuse for 24 hours related to this e-Visit. If it has been greater than 24 hours you will need to follow up with your provider, or enter a new e-Visit to address those concerns. You will get a MyChart message within the next two days asking about your experience. I hope that your e-visit has been valuable and will speed your recovery.  I have spent 5 minutes in review of e-visit questionnaire, review and updating patient chart, medical decision making and response to patient.   Elsie Velma Lunger, PA-C

## 2024-11-02 NOTE — Progress Notes (Signed)
 Duplicate encounter - disregard.

## 2024-12-06 ENCOUNTER — Other Ambulatory Visit (INDEPENDENT_AMBULATORY_CARE_PROVIDER_SITE_OTHER)

## 2024-12-06 ENCOUNTER — Other Ambulatory Visit

## 2024-12-06 VITALS — BP 125/81 | HR 110 | Wt 165.0 lb

## 2024-12-06 DIAGNOSIS — O3680X0 Pregnancy with inconclusive fetal viability, not applicable or unspecified: Secondary | ICD-10-CM

## 2024-12-06 DIAGNOSIS — Z333 Pregnant state, gestational carrier: Secondary | ICD-10-CM | POA: Insufficient documentation

## 2024-12-06 DIAGNOSIS — O09811 Supervision of pregnancy resulting from assisted reproductive technology, first trimester: Secondary | ICD-10-CM

## 2024-12-06 DIAGNOSIS — Z3A08 8 weeks gestation of pregnancy: Secondary | ICD-10-CM

## 2024-12-06 DIAGNOSIS — O0990 Supervision of high risk pregnancy, unspecified, unspecified trimester: Secondary | ICD-10-CM | POA: Insufficient documentation

## 2024-12-06 NOTE — Progress Notes (Signed)
 New OB Intake  I explained I am completing New OB Intake today. We discussed EDD of 07/14/2025, by Embryo Transfer. Pt is [id number]. I reviewed her allergies, medications and Medical/Surgical/OB history.    Patient Active Problem List   Diagnosis Date Noted   Supervision of high risk pregnancy, antepartum 12/06/2024   Surrogate pregnancy, antepartum 12/06/2024   Leukopenia 08/23/2023   Anxiety and depression 05/23/2022    Concerns addressed today  Patient informed that the ultrasound is considered a limited obstetric ultrasound and is not intended to be a complete ultrasound exam.  Patient also informed that the ultrasound is not being completed with the intent of assessing for fetal or placental anomalies or any pelvic abnormalities. Explained that the purpose of today's ultrasound is to assess for viability.  Patient acknowledges the purpose of the exam and the limitations of the study.     Delivery Plans Plans to deliver at [facility]. Discussed the nature of our practice with multiple providers including residents and students. Due to the size of the practice, the delivering provider may not be the same as those providing prenatal care.    Anatomy US  Explained first scheduled US  will be around 19 weeks.   Last Pap Diagnosis  Date Value Ref Range Status  08/21/2023   Final   - Negative for intraepithelial lesion or malignancy (NILM)    First visit review I reviewed new OB appt with patient. Explained pt will be seen by Dr Izell at first visit. Discussed [place] genetic screening with patient is not needed. Routine prenatal labs to be collected at [facility].    Wanda Buckles, RN 12/06/2024  4:22 PM

## 2024-12-23 ENCOUNTER — Encounter: Admitting: Obstetrics and Gynecology

## 2024-12-30 ENCOUNTER — Telehealth: Payer: Self-pay | Admitting: *Deleted

## 2024-12-30 ENCOUNTER — Other Ambulatory Visit (HOSPITAL_COMMUNITY)
Admission: RE | Admit: 2024-12-30 | Discharge: 2024-12-30 | Disposition: A | Source: Ambulatory Visit | Attending: Obstetrics and Gynecology | Admitting: Obstetrics and Gynecology

## 2024-12-30 ENCOUNTER — Ambulatory Visit: Admitting: Obstetrics and Gynecology

## 2024-12-30 ENCOUNTER — Encounter: Payer: Self-pay | Admitting: Obstetrics and Gynecology

## 2024-12-30 VITALS — BP 119/82 | HR 94 | Wt 170.0 lb

## 2024-12-30 DIAGNOSIS — O09529 Supervision of elderly multigravida, unspecified trimester: Secondary | ICD-10-CM | POA: Insufficient documentation

## 2024-12-30 DIAGNOSIS — Z3491 Encounter for supervision of normal pregnancy, unspecified, first trimester: Secondary | ICD-10-CM | POA: Insufficient documentation

## 2024-12-30 DIAGNOSIS — Z3A12 12 weeks gestation of pregnancy: Secondary | ICD-10-CM | POA: Insufficient documentation

## 2024-12-30 DIAGNOSIS — Z131 Encounter for screening for diabetes mellitus: Secondary | ICD-10-CM

## 2024-12-30 DIAGNOSIS — Z333 Pregnant state, gestational carrier: Secondary | ICD-10-CM

## 2024-12-30 DIAGNOSIS — O26899 Other specified pregnancy related conditions, unspecified trimester: Secondary | ICD-10-CM

## 2024-12-30 DIAGNOSIS — O09819 Supervision of pregnancy resulting from assisted reproductive technology, unspecified trimester: Secondary | ICD-10-CM | POA: Insufficient documentation

## 2024-12-30 DIAGNOSIS — K219 Gastro-esophageal reflux disease without esophagitis: Secondary | ICD-10-CM

## 2024-12-30 DIAGNOSIS — O131 Gestational [pregnancy-induced] hypertension without significant proteinuria, first trimester: Secondary | ICD-10-CM | POA: Insufficient documentation

## 2024-12-30 MED ORDER — CALCIUM CARBONATE ANTACID 500 MG PO CHEW: CHEWABLE_TABLET | ORAL | Status: AC

## 2024-12-30 MED ORDER — ASPIRIN 81 MG PO TBEC: 81.0000 mg | DELAYED_RELEASE_TABLET | Freq: Every day | ORAL | 1 refills | Status: AC

## 2024-12-30 NOTE — Progress Notes (Signed)
 New OB Note  12/30/2024   Clinic: [facility]  Chief Complaint: new OB  Transfer of Care Patient: yes  History of Present Illness: Tara [name] is a 39 y.o. G6P5006 at 12/2 weeks (EDC 8/11, based on embryo transfer). Patient is a gestational surrogate.   Pregnancy complicated by has Rh negative state in antepartum period; Anxiety and depression; Leukopenia; Supervision of high risk pregnancy, antepartum; Surrogate pregnancy, antepartum; Transient hypertension of pregnancy in first trimester; and Antepartum multigravida of advanced maternal age on their problem list.   GERD s/s. No SAB s/s.   ROS: A 12-point review of systems was performed and negative, except as stated in the above HPI.  OBGYN History: As per HPI. OB History  Gravida Para Term Preterm AB Living  6 5 5  0 0 6  SAB IAB Ectopic Multiple Live Births  0 0 0 1 6    # Outcome Date GA Lbr Len/2nd Weight Sex Type Anes PTL Lv  6 Current           5A Term 07/2018 [redacted]w[redacted]d    Vag-Spont   LIV  5B Term 07/2018 [redacted]w[redacted]d    Vag-Spont   LIV  4 Term 05/13/14 [redacted]w[redacted]d / 00:06 8 lb 3.6 oz (3.731 kg) M Vag-Spont EPI  LIV  3 Term 2013 [redacted]w[redacted]d  7 lb (3.175 kg) M Vag-Spont   LIV  2 Term 09/04/09 [redacted]w[redacted]d  6 lb 15 oz (3.147 kg) M Vag-Spont EPI  LIV  1 Term 08/04/08 [redacted]w[redacted]d  6 lb 15 oz (3.147 kg) F Vag-Spont EPI  LIV    Obstetric Comments  Twins 07/2018    History of pap smears: Yes. Last pap smear 2024 and results were negative.    Past Medical History: Past Medical History:  Diagnosis Date   ASCUS of cervix with negative high risk HPV on 12/29/2017 12/31/2017   Routine screening as per ASCCP guidelines     Menorrhagia with regular cycle 12/17/2016   Vaginal Pap smear, abnormal    Past Surgical History: Past Surgical History:  Procedure Laterality Date   COLPOSCOPY     LAPAROSCOPIC BILATERAL SALPINGECTOMY Bilateral 09/18/2018   Procedure: LAPAROSCOPIC BILATERAL SALPINGECTOMY;  Surgeon: Fredirick Glenys RAMAN, MD;   Location: [facility];  Service: Gynecology;  Laterality: Bilateral;   Family History:  Family History  Problem Relation Age of Onset   Hypertension Mother    Arthritis Maternal Grandmother    Alcohol abuse Neg Hx    Asthma Neg Hx    Birth defects Neg Hx    Cancer Neg Hx    COPD Neg Hx    Diabetes Neg Hx    Depression Neg Hx    Drug abuse Neg Hx    Early death Neg Hx    Hearing loss Neg Hx    Heart disease Neg Hx    Hyperlipidemia Neg Hx    Kidney disease Neg Hx    Learning disabilities Neg Hx    Mental illness Neg Hx    Mental retardation Neg Hx    Miscarriages / Stillbirths Neg Hx    Stroke Neg Hx    Vision loss Neg Hx    Varicose Veins Neg Hx    Social History:  Social History   Socioeconomic History   Marital status: Married    Spouse name: Not on file   Number of children: Not on file   Years of education: Not on file   Highest education level: Not on file  Occupational History   Not on file  Tobacco Use   Smoking status: Never   Smokeless tobacco: Never  Vaping Use   Vaping status: Never Used  Substance and Sexual Activity   Alcohol use: No    Alcohol/week: 0.0 standard drinks of alcohol   Drug use: Never   Sexual activity: Yes    Partners: Male    Birth control/protection: None  Other Topics Concern   Not on file  Social History Narrative   Not on file   Social Drivers of Health   Tobacco Use: Low Risk (12/06/2024)   Patient History    Smoking Tobacco Use: Never    Smokeless Tobacco Use: Never    Passive Exposure: Not on file  Financial Resource Strain: Not on file  Food Insecurity: Not on file  Transportation Needs: Not on file  Physical Activity: Not on file  Stress: Not on file  Social Connections: Not on file  Intimate Partner Violence: Not on file  Depression (PHQ2-9): Low Risk (08/21/2023)   Depression (PHQ2-9)    PHQ-2 Score: 1  Alcohol Screen: Not on file  Housing: Not on file  Utilities: Not on file  Health  Literacy: Not on file    Allergy: Allergies[1]  Current Outpatient Medications: Prenatal. B6  Physical Exam:   BP 119/82   Pulse 94   Wt 170 lb (77.1 kg)   BMI 29.64 kg/m  Body mass index is 29.64 kg/m. Contractions: Not present Vag. Bleeding: None. Fundal height: not applicable FHTs: 160s  General appearance: Well nourished, well developed female in no acute distress.  Respiratory:  Normal respiratory effort Abdomen: soft, nttp, nd Neuro/Psych:  Normal mood and affect.   Laboratory: none  Imaging:  As per HPI  Assessment: patient stable  Plan: 1. Gastroesophageal reflux disease without esophagitis OTC tums recommended  2. Transient hypertension of pregnancy in first trimester (Primary) Repeat BP wnl. Pt amenable to low dose ASA. Baseline pre-eclampsia labs today - Comprehensive metabolic panel with GFR - Protein / creatinine ratio, urine - TSH Rfx on Abnormal to Free T4  3. Surrogate pregnancy, antepartum No records available except some through [facility]. Patient asked to ask IVF clinic to please send over records. Per patient, genetics wnl  Follow up mfm after anatomy u/s for need for fetal echo 4. Screening for diabetes mellitus - Hemoglobin A1c  5. [redacted] weeks gestation of pregnancy Has 2/5 [facility] f/u u/s to evaluate intestines to ensure no omphalacele/gastrochesis.  - CBC/D/Plt+RPR+Rh+ABO+RubIgG... - Hemoglobin A1c - Culture, OB Urine - Comprehensive metabolic panel with GFR - Protein / creatinine ratio, urine - TSH Rfx on Abnormal to Free T4 - Cervicovaginal ancillary only( [place])  6. Antepartum multigravida of advanced maternal age  34. Rh negative state in antepartum period  Problem list reviewed and updated.  Follow up in 4 weeks.  The nature of [facility] with multiple MDs and other Advanced Practice Providers was explained to patient; also emphasized that residents, students are part of our  team.  >50% of 35 min visit spent on counseling and coordination of care.   No follow-ups on file. Future Appointments  Date Time Provider Department Center  02/17/2025  9:00 AM [facility] PROVIDER 1 [facility]  02/17/2025  9:30 AM [facility] US2 [facility]   Bebe Izell Overcast MD Attending [facility] for [facility])     [1] No Known Allergies

## 2024-12-30 NOTE — Telephone Encounter (Signed)
 Voice mail is full

## 2024-12-31 LAB — COMPREHENSIVE METABOLIC PANEL WITH GFR
ALT: 13 [IU]/L (ref 0–32)
AST: 17 [IU]/L (ref 0–40)
Albumin: 3.8 g/dL — ABNORMAL LOW (ref 3.9–4.9)
Alkaline Phosphatase: 65 [IU]/L (ref 41–116)
BUN/Creatinine Ratio: 13 (ref 9–23)
BUN: 8 mg/dL (ref 6–20)
Bilirubin Total: 0.2 mg/dL (ref 0.0–1.2)
CO2: 20 mmol/L (ref 20–29)
Calcium: 9.1 mg/dL (ref 8.7–10.2)
Chloride: 103 mmol/L (ref 96–106)
Creatinine, Ser: 0.64 mg/dL (ref 0.57–1.00)
Globulin, Total: 2.7 g/dL (ref 1.5–4.5)
Glucose: 95 mg/dL (ref 70–99)
Potassium: 4.1 mmol/L (ref 3.5–5.2)
Sodium: 137 mmol/L (ref 134–144)
Total Protein: 6.5 g/dL (ref 6.0–8.5)
eGFR: 117 mL/min/{1.73_m2}

## 2024-12-31 LAB — PROTEIN / CREATININE RATIO, URINE
Creatinine, Urine: 59.4 mg/dL
Protein, Ur: 5.5 mg/dL
Protein/Creat Ratio: 93 mg/g{creat} (ref 0–200)

## 2024-12-31 LAB — CBC/D/PLT+RPR+RH+ABO+RUBIGG...
Antibody Screen: NEGATIVE
Basophils Absolute: 0 10*3/uL (ref 0.0–0.2)
Basos: 1 %
EOS (ABSOLUTE): 0 10*3/uL (ref 0.0–0.4)
Eos: 0 %
HCV Ab: NONREACTIVE
HIV Screen 4th Generation wRfx: NONREACTIVE
Hematocrit: 40.3 % (ref 34.0–46.6)
Hemoglobin: 13.1 g/dL (ref 11.1–15.9)
Hepatitis B Surface Ag: NEGATIVE
Immature Grans (Abs): 0.1 10*3/uL (ref 0.0–0.1)
Immature Granulocytes: 2 %
Lymphocytes Absolute: 1.5 10*3/uL (ref 0.7–3.1)
Lymphs: 22 %
MCH: 31 pg (ref 26.6–33.0)
MCHC: 32.5 g/dL (ref 31.5–35.7)
MCV: 95 fL (ref 79–97)
Monocytes Absolute: 0.3 10*3/uL (ref 0.1–0.9)
Monocytes: 5 %
Neutrophils Absolute: 4.8 10*3/uL (ref 1.4–7.0)
Neutrophils: 70 %
Platelets: 420 10*3/uL (ref 150–450)
RBC: 4.23 x10E6/uL (ref 3.77–5.28)
RDW: 12.5 % (ref 11.7–15.4)
RPR Ser Ql: NONREACTIVE
Rh Factor: NEGATIVE
Rubella Antibodies, IGG: 1.36 {index}
WBC: 6.9 10*3/uL (ref 3.4–10.8)

## 2024-12-31 LAB — HCV INTERPRETATION

## 2024-12-31 LAB — CERVICOVAGINAL ANCILLARY ONLY
Chlamydia: NEGATIVE
Comment: NEGATIVE
Comment: NORMAL
Neisseria Gonorrhea: NEGATIVE

## 2024-12-31 LAB — HEMOGLOBIN A1C
Est. average glucose Bld gHb Est-mCnc: 103 mg/dL
Hgb A1c MFr Bld: 5.2 % (ref 4.8–5.6)

## 2024-12-31 LAB — TSH RFX ON ABNORMAL TO FREE T4: TSH: 1.3 u[IU]/mL (ref 0.450–4.500)

## 2025-01-03 LAB — CULTURE, OB URINE

## 2025-01-03 LAB — URINE CULTURE, OB REFLEX

## 2025-01-05 ENCOUNTER — Encounter: Payer: Self-pay | Admitting: Obstetrics and Gynecology

## 2025-01-06 ENCOUNTER — Encounter: Payer: Self-pay | Admitting: *Deleted

## 2025-01-28 ENCOUNTER — Encounter: Admitting: Certified Nurse Midwife

## 2025-02-17 ENCOUNTER — Ambulatory Visit

## 2025-02-17 ENCOUNTER — Other Ambulatory Visit
